# Patient Record
Sex: Female | Born: 1955 | Race: Black or African American | Hispanic: No | Marital: Single | Smoking: Never smoker
Health system: Southern US, Community
[De-identification: ages and names within clinical notes are randomized; demographics above are authoritative.]

## PROBLEM LIST (undated history)

## (undated) DIAGNOSIS — I639 Cerebral infarction, unspecified: Secondary | ICD-10-CM

## (undated) DIAGNOSIS — I10 Essential (primary) hypertension: Secondary | ICD-10-CM

---

## 2019-07-21 ENCOUNTER — Encounter (HOSPITAL_COMMUNITY): Payer: Self-pay

## 2019-07-21 ENCOUNTER — Emergency Department (HOSPITAL_COMMUNITY)
Admission: EM | Admit: 2019-07-21 | Discharge: 2019-07-21 | Disposition: A | Discharge status: Home / Self Care | Payer: BC Managed Care – PPO | Attending: Emergency Medicine | Admitting: Emergency Medicine

## 2019-07-21 ENCOUNTER — Encounter (HOSPITAL_COMMUNITY): Payer: Self-pay | Admitting: Emergency Medicine

## 2019-07-21 ENCOUNTER — Ambulatory Visit (HOSPITAL_COMMUNITY)
Admission: EM | Admit: 2019-07-21 | Discharge: 2019-07-21 | Disposition: A | Discharge status: Home / Self Care | Payer: Self-pay | Attending: Emergency Medicine | Admitting: Emergency Medicine

## 2019-07-21 ENCOUNTER — Other Ambulatory Visit: Payer: Self-pay

## 2019-07-21 DIAGNOSIS — R202 Paresthesia of skin: Secondary | ICD-10-CM | POA: Diagnosis not present

## 2019-07-21 DIAGNOSIS — R42 Dizziness and giddiness: Secondary | ICD-10-CM

## 2019-07-21 DIAGNOSIS — R5383 Other fatigue: Secondary | ICD-10-CM

## 2019-07-21 DIAGNOSIS — R531 Weakness: Secondary | ICD-10-CM

## 2019-07-21 DIAGNOSIS — Z5321 Procedure and treatment not carried out due to patient leaving prior to being seen by health care provider: Secondary | ICD-10-CM | POA: Diagnosis not present

## 2019-07-21 HISTORY — DX: Essential (primary) hypertension: I10

## 2019-07-21 LAB — BASIC METABOLIC PANEL
Anion gap: 11 (ref 5–15)
BUN: 7 mg/dL — ABNORMAL LOW (ref 8–23)
CO2: 25 mmol/L (ref 22–32)
Calcium: 9.9 mg/dL (ref 8.9–10.3)
Chloride: 104 mmol/L (ref 98–111)
Creatinine, Ser: 0.78 mg/dL (ref 0.44–1.00)
GFR calc Af Amer: >60 mL/min (ref >60)
GFR calc non Af Amer: >60 mL/min (ref >60)
Glucose, Bld: 98 mg/dL (ref 70–99)
Potassium: 3.8 mmol/L (ref 3.5–5.1)
Sodium: 140 mmol/L (ref 135–145)

## 2019-07-21 LAB — URINALYSIS, ROUTINE W REFLEX MICROSCOPIC
Bacteria, UA: NONE SEEN
Bilirubin Urine: NEGATIVE
Glucose, UA: NEGATIVE mg/dL
Ketones, ur: NEGATIVE mg/dL
Leukocytes,Ua: NEGATIVE
Nitrite: NEGATIVE
Protein, ur: NEGATIVE mg/dL
Specific Gravity, Urine: 1.001 — ABNORMAL LOW (ref 1.005–1.030)
pH: 6 (ref 5.0–8.0)

## 2019-07-21 LAB — CBC
HCT: 36.6 % (ref 36.0–46.0)
Hemoglobin: 12.9 g/dL (ref 12.0–15.0)
MCH: 27.7 pg (ref 26.0–34.0)
MCHC: 35.2 g/dL (ref 30.0–36.0)
MCV: 78.7 fL — ABNORMAL LOW (ref 80.0–100.0)
Platelets: 388 10*3/uL (ref 150–400)
RBC: 4.65 MIL/uL (ref 3.87–5.11)
RDW: 13.2 % (ref 11.5–15.5)
WBC: 9.6 10*3/uL (ref 4.0–10.5)
nRBC: 0 % (ref 0.0–0.2)

## 2019-07-21 LAB — CBG MONITORING, ED: Glucose-Capillary: 98 mg/dL (ref 70–99)

## 2019-07-21 MED ORDER — SODIUM CHLORIDE 0.9% FLUSH: 3.0000 mL | Freq: Once | INTRAVENOUS | Status: DC

## 2019-07-21 NOTE — ED Notes (Signed)
Patient is being discharged from the Urgent Care and sent to the Emergency Department via POV. Per H. Wieters, PA, patient is in need of higher level of care due to r/o CVA, HTN, weakness/fatigue. Patient is aware and verbalizes understanding of plan of care.  Vitals:   07/21/19 1803  BP: (!) 189/93  Pulse: 76  Resp: 18  Temp: 98 F (36.7 C)  SpO2: 100%

## 2019-07-21 NOTE — ED Notes (Signed)
Daryll Drown, PA advised pt to go to ER for higher level care 2/2 HTN, extremity weakness, increasing fatigue. Pt verbalized understanding.

## 2019-07-21 NOTE — ED Notes (Signed)
Called to recheck V/S, unable to locate at this time.

## 2019-07-21 NOTE — ED Notes (Signed)
Pt called for VS x3, no response. °

## 2019-07-21 NOTE — ED Triage Notes (Signed)
Pt reports "tingling and heavy feeling" to her right arm last Tuesday which resolved that day.  Since then she has been increasing feeling weak.  Negative NIH in triage.

## 2019-07-21 NOTE — ED Triage Notes (Signed)
Pt c/o "tingling and heavy" to right arm and right leg on Tuesday which resolved on Wednesday.  Pt reports she generally feels weak/tired, dizzy, increased thirst, increased urination since Thursday. Pt states she is supposed to be taking a Rx for HTN, but does not.  Denies CP, SOB, slurred/difficulty speaking, n/v/d, fever, chills.  Smile symmetrical, grips equal/moderate strength, leg equal/strong. EKG results to H. Wieters, CBG ordered/completed with result 98. Daryll Drown in for eval in triage.

## 2019-07-22 ENCOUNTER — Emergency Department (HOSPITAL_COMMUNITY): Payer: BC Managed Care – PPO

## 2019-07-22 ENCOUNTER — Other Ambulatory Visit: Payer: Self-pay

## 2019-07-22 ENCOUNTER — Observation Stay (HOSPITAL_COMMUNITY)
Admission: EM | Admit: 2019-07-22 | Discharge: 2019-07-23 | Disposition: A | Discharge status: Home / Self Care | Payer: BC Managed Care – PPO | Attending: Internal Medicine | Admitting: Internal Medicine

## 2019-07-22 DIAGNOSIS — Z20822 Contact with and (suspected) exposure to covid-19: Secondary | ICD-10-CM | POA: Insufficient documentation

## 2019-07-22 DIAGNOSIS — I639 Cerebral infarction, unspecified: Principal | ICD-10-CM

## 2019-07-22 DIAGNOSIS — I63312 Cerebral infarction due to thrombosis of left middle cerebral artery: Secondary | ICD-10-CM

## 2019-07-22 DIAGNOSIS — I10 Essential (primary) hypertension: Secondary | ICD-10-CM

## 2019-07-22 LAB — DIFFERENTIAL
Abs Immature Granulocytes: 0.02 10*3/uL (ref 0.00–0.07)
Basophils Absolute: 0.1 10*3/uL (ref 0.0–0.1)
Basophils Relative: 1 %
Eosinophils Absolute: 0.3 10*3/uL (ref 0.0–0.5)
Eosinophils Relative: 3 %
Immature Granulocytes: 0 %
Lymphocytes Relative: 42 %
Lymphs Abs: 3.9 10*3/uL (ref 0.7–4.0)
Monocytes Absolute: 0.7 10*3/uL (ref 0.1–1.0)
Monocytes Relative: 8 %
Neutro Abs: 4.5 10*3/uL (ref 1.7–7.7)
Neutrophils Relative %: 46 %

## 2019-07-22 LAB — I-STAT CHEM 8, ED
BUN: 9 mg/dL (ref 8–23)
Calcium, Ion: 1.27 mmol/L (ref 1.15–1.40)
Chloride: 101 mmol/L (ref 98–111)
Creatinine, Ser: 0.8 mg/dL (ref 0.44–1.00)
Glucose, Bld: 153 mg/dL — ABNORMAL HIGH (ref 70–99)
HCT: 35 % — ABNORMAL LOW (ref 36.0–46.0)
Hemoglobin: 11.9 g/dL — ABNORMAL LOW (ref 12.0–15.0)
Potassium: 3.7 mmol/L (ref 3.5–5.1)
Sodium: 141 mmol/L (ref 135–145)
TCO2: 29 mmol/L (ref 22–32)

## 2019-07-22 LAB — COMPREHENSIVE METABOLIC PANEL
ALT: 14 U/L (ref 0–44)
AST: 20 U/L (ref 15–41)
Albumin: 3.7 g/dL (ref 3.5–5.0)
Alkaline Phosphatase: 76 U/L (ref 38–126)
Anion gap: 11 (ref 5–15)
BUN: 7 mg/dL — ABNORMAL LOW (ref 8–23)
CO2: 26 mmol/L (ref 22–32)
Calcium: 9.4 mg/dL (ref 8.9–10.3)
Chloride: 105 mmol/L (ref 98–111)
Creatinine, Ser: 0.86 mg/dL (ref 0.44–1.00)
GFR calc Af Amer: >60 mL/min (ref >60)
GFR calc non Af Amer: >60 mL/min (ref >60)
Glucose, Bld: 156 mg/dL — ABNORMAL HIGH (ref 70–99)
Potassium: 3.9 mmol/L (ref 3.5–5.1)
Sodium: 142 mmol/L (ref 135–145)
Total Bilirubin: 0.7 mg/dL (ref 0.3–1.2)
Total Protein: 6.8 g/dL (ref 6.5–8.1)

## 2019-07-22 LAB — CBC
HCT: 34.7 % — ABNORMAL LOW (ref 36.0–46.0)
Hemoglobin: 12.1 g/dL (ref 12.0–15.0)
MCH: 27.5 pg (ref 26.0–34.0)
MCHC: 34.9 g/dL (ref 30.0–36.0)
MCV: 78.9 fL — ABNORMAL LOW (ref 80.0–100.0)
Platelets: 381 10*3/uL (ref 150–400)
RBC: 4.4 MIL/uL (ref 3.87–5.11)
RDW: 13.3 % (ref 11.5–15.5)
WBC: 9.5 10*3/uL (ref 4.0–10.5)
nRBC: 0 % (ref 0.0–0.2)

## 2019-07-22 LAB — APTT: aPTT: 27 seconds (ref 24–36)

## 2019-07-22 LAB — PROTIME-INR
INR: 0.9 (ref 0.8–1.2)
Prothrombin Time: 11.6 seconds (ref 11.4–15.2)

## 2019-07-22 MED ORDER — LORAZEPAM 2 MG/ML IJ SOLN: 1.0000 mg | Freq: Once | INTRAMUSCULAR | Status: DC | PRN

## 2019-07-22 MED ORDER — SODIUM CHLORIDE 0.9% FLUSH
3.0000 mL | Freq: Once | INTRAVENOUS | Status: DC
Start: 2019-07-22 — End: 2019-07-23

## 2019-07-22 NOTE — ED Notes (Signed)
Transported to MRI

## 2019-07-22 NOTE — ED Provider Notes (Signed)
[facility]    CSN: [id number] Arrival date & time: 07/21/19  1754      History   Chief Complaint Chief Complaint  Patient presents with   Fatigue   Extremity Weakness    HPI Lindsay Benitez is a 64 y.o. female history of hypertension presenting today for evaluation of fatigue as well as episode of right-sided tingling and weakness.  Patient reports earlier this week on Tuesday she had a sensation of weakness and tingling on the right side in her arm and leg which she woke up with.  Symptoms subsided after approximately 1 day.  She denies associated headache or vision changes.  Denies chest pain or shortness of breath.  Denied feeling confused or difficulty speaking at the time.  Since she has felt very fatigued and dizzy which has persisted but worsened over the past 24 hours.  She was previously on blood pressure medicine but has been off of this of recently.  Reports eating increased salty foods of recently.  HPI  Past Medical History:  Diagnosis Date   Hypertension     There are no problems to display for this patient.   Past Surgical History:  Procedure Laterality Date   CESAREAN SECTION      OB History   No obstetric history on file.      Home Medications    Prior to Admission medications   Not on File    Family History No family history on file.  Social History Social History   Tobacco Use   Smoking status: Not on file  Substance Use Topics   Alcohol use: Never   Drug use: Not on file     Allergies   Patient has no known allergies.   Review of Systems Review of Systems  Constitutional: Positive for fatigue. Negative for fever.  HENT: Negative for congestion, sinus pressure and sore throat.   Eyes: Negative for photophobia, pain and visual disturbance.  Respiratory: Negative for cough and shortness of breath.   Cardiovascular: Negative for chest pain.  Gastrointestinal: Negative for abdominal pain, nausea and vomiting.    Genitourinary: Negative for decreased urine volume and hematuria.  Musculoskeletal: Negative for myalgias, neck pain and neck stiffness.  Neurological: Positive for dizziness. Negative for syncope, facial asymmetry, speech difficulty, weakness, light-headedness, numbness and headaches.     Physical Exam Triage Vital Signs ED Triage Vitals  Enc Vitals Group     BP 07/21/19 1803 (!) 189/93     Pulse Rate 07/21/19 1803 76     Resp 07/21/19 1803 18     Temp 07/21/19 1803 98 F (36.7 C)     Temp Source 07/21/19 1803 Oral     SpO2 07/21/19 1803 100 %     Weight --      Height --      Head Circumference --      Peak Flow --      Pain Score 07/21/19 1802 0     Pain Loc --      Pain Edu? --      Excl. in GC? --    No data found.  Updated Vital Signs BP (!) 189/93 (BP Location: Right Arm)    Pulse 76    Temp 98 F (36.7 C) (Oral)    Resp 18    SpO2 100%   Visual Acuity Right Eye Distance:   Left Eye Distance:   Bilateral Distance:    Right Eye Near:   Left Eye Near:  Bilateral Near:     Physical Exam Vitals and nursing note reviewed.  Constitutional:      Appearance: She is well-developed.     Comments: No acute distress  HENT:     Head: Normocephalic and atraumatic.     Nose: Nose normal.  Eyes:     Extraocular Movements: Extraocular movements intact.     Conjunctiva/sclera: Conjunctivae normal.     Pupils: Pupils are equal, round, and reactive to light.  Cardiovascular:     Rate and Rhythm: Normal rate.  Pulmonary:     Effort: Pulmonary effort is normal. No respiratory distress.     Comments: Breathing comfortably at rest, CTABL, no wheezing, rales or other adventitious sounds auscultated Abdominal:     General: There is no distension.  Musculoskeletal:        General: Normal range of motion.     Cervical back: Neck supple.  Skin:    General: Skin is warm and dry.  Neurological:     General: No focal deficit present.     Mental Status: She is alert and  oriented to person, place, and time. Mental status is at baseline.     Comments: Patient A&O x3, cranial nerves II-XII grossly intact, strength at shoulders, hips and knees 5/5, equal bilaterally, patellar reflex 2+ bilaterally.  Gait without abnormality.      UC Treatments / Results  Labs (all labs ordered are listed, but only abnormal results are displayed) Labs Reviewed  CBG MONITORING, ED    EKG   Radiology No results found.  Procedures Procedures (including critical care time)  Medications Ordered in UC Medications - No data to display  Initial Impression / Assessment and Plan / UC Course  I have reviewed the triage vital signs and the nursing notes.  Pertinent labs & imaging results that were available during my care of the patient were reviewed by me and considered in my medical decision making (see chart for details).     Blood pressure elevated at 189/93, no residual weakness noted on exam today, symptoms earlier this week concerning for possible TIA.  Continued fatigue and dizziness since.  EKG normal sinus rhythm without acute signs of ischemia or infarction, blood sugar 98.  Discussed with patient recommended further evaluation and work-up in the emergency room to ensure safe to go home.  Feel would benefit from blood work as well as possible CT.  Patient verbalized understanding.  Sent independently with family member to ED.  Discussed strict return precautions. Patient verbalized understanding and is agreeable with plan.  Final Clinical Impressions(s) / UC Diagnoses   Final diagnoses:  None   Discharge Instructions   None    ED Prescriptions    None     PDMP not reviewed this encounter.   Lew Dawes, PA-C 07/22/19 1347

## 2019-07-22 NOTE — Code Documentation (Signed)
Responded to Code Stroke called at 2240 for R sided weakness and ataxia, [id number]. Code Stroke was called while pt in triage. CBG-153, NIH-0. Pt c/o dizziness on exam. CT head negative. Plan for MRI.

## 2019-07-22 NOTE — ED Notes (Signed)
Assumed care on patient , patient placed on a monitor and continuous pulse oximetry , respirations unlabored , denies pain IV site intact , no neuro deficits / no numbness at this time , she passed the swallowing test . Waiting for MRI .

## 2019-07-22 NOTE — ED Triage Notes (Addendum)
Pt presents to ED POV. Pt c/o numbness and weakness in her R arm and leg. Pt also c/o ataxia, dizziness. Neuro intact. NIHH - 0. Pt seen for same recently. Same symptoms last tues. LKW 2200 07/22/2019

## 2019-07-22 NOTE — ED Provider Notes (Signed)
[facility] EMERGENCY DEPARTMENT Provider Note   CSN: [id number] Arrival date & time: 07/22/19  2215  An emergency department physician performed an initial assessment on this suspected stroke patient at 2230.  History Chief Complaint  Patient presents with  . Code Stroke  . Fatigue    [name] is a 64 y.o. female.  The history is provided by the patient and medical records.    64 y.o. F with hx of HTN, presenting to the ED for right sided weakness/tingling.  States the first episode occurred on Tuesday (07/17/19) and fully resolved after about 1 day so she was not evaluated.  States she had a recurrence of symptoms yesterday along with dizziness and fatigue so she went to urgent care.  States she was sent to the ED and waited for approx 6 hours before leaving -- states her shoulder started hurting so she left.  States prior to this past week she has never had symptoms like this before.  She does report history of hypertension and states she is not very compliant with her medications.  Past Medical History:  Diagnosis Date  . Hypertension     There are no problems to display for this patient.   Past Surgical History:  Procedure Laterality Date  . CESAREAN SECTION       OB History   No obstetric history on file.     No family history on file.  Social History   Tobacco Use  . Smoking status: Not on file  Substance Use Topics  . Alcohol use: Never  . Drug use: Not on file    Home Medications Prior to Admission medications   Not on File    Allergies    Patient has no known allergies.  Review of Systems   Review of Systems  Neurological: Positive for weakness.  All other systems reviewed and are negative.   Physical Exam Updated Vital Signs BP (!) 143/77 (BP Location: Right Arm)   Pulse 87   Temp 98.4 F (36.9 C) (Oral)   Resp 16   Ht 5\' 2"  (1.575 m)   Wt 77.1 kg   SpO2 97%   BMI 31.09 kg/m   Physical Exam Vitals and  nursing note reviewed.  Constitutional:      Appearance: She is well-developed.  HENT:     Head: Normocephalic and atraumatic.  Eyes:     Conjunctiva/sclera: Conjunctivae normal.     Pupils: Pupils are equal, round, and reactive to light.  Cardiovascular:     Rate and Rhythm: Normal rate and regular rhythm.     Heart sounds: Normal heart sounds.  Pulmonary:     Effort: Pulmonary effort is normal.     Breath sounds: Normal breath sounds.  Abdominal:     General: Bowel sounds are normal.     Palpations: Abdomen is soft.  Musculoskeletal:        General: Normal range of motion.     Cervical back: Normal range of motion.  Skin:    General: Skin is warm and dry.  Neurological:     Mental Status: She is alert and oriented to person, place, and time.     Comments: AAOx3, answering questions and following commands appropriately; equal strength UE and LE bilaterally; CN grossly intact; moves all extremities appropriately without ataxia; no focal neuro deficits or facial asymmetry appreciated, speech clear and goal oriented     ED Results / Procedures / Treatments   Labs (all labs ordered  are listed, but only abnormal results are displayed) Labs Reviewed  CBC - Abnormal; Notable for the following components:      Result Value   HCT 34.7 (*)    MCV 78.9 (*)    All other components within normal limits  COMPREHENSIVE METABOLIC PANEL - Abnormal; Notable for the following components:   Glucose, Bld 156 (*)    BUN 7 (*)    All other components within normal limits  I-STAT CHEM 8, ED - Abnormal; Notable for the following components:   Glucose, Bld 153 (*)    Hemoglobin 11.9 (*)    HCT 35.0 (*)    All other components within normal limits  SARS CORONAVIRUS 2 BY RT PCR (HOSPITAL ORDER, PERFORMED IN [facility])  PROTIME-INR  APTT  DIFFERENTIAL    EKG None  Radiology CT HEAD CODE STROKE WO CONTRAST`  Result Date: 07/22/2019 CLINICAL DATA:  Code stroke.  Right-sided  numbness EXAM: CT HEAD WITHOUT CONTRAST TECHNIQUE: Contiguous axial images were obtained from the base of the skull through the vertex without intravenous contrast. COMPARISON:  None. FINDINGS: Brain: There is no mass, hemorrhage or extra-axial collection. The size and configuration of the ventricles and extra-axial CSF spaces are normal. The brain parenchyma is normal, without evidence of acute or chronic infarction. There is dense calcification within the basal ganglia, dentate nuclei and both occipital lobes. Vascular: No abnormal hyperdensity of the major intracranial arteries or dural venous sinuses. No intracranial atherosclerosis. Skull: The visualized skull base, calvarium and extracranial soft tissues are normal. Sinuses/Orbits: No fluid levels or advanced mucosal thickening of the visualized paranasal sinuses. No mastoid or middle ear effusion. The orbits are normal. ASPECTS [facility] Stroke Program Early CT Score) - Ganglionic level infarction (caudate, lentiform nuclei, internal capsule, insula, M1-M3 cortex): 7 - Supraganglionic infarction (M4-M6 cortex): 3 Total score (0-10 with 10 being normal): 10 IMPRESSION: 1. No acute intracranial abnormality. 2. ASPECTS is 10. 3. Dense calcification of the basal ganglia, dentate nuclei and both occipital lobes. 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 4. These results were communicated to Dr. Georgiana Spinner Aroor at 10:53 pm on 07/22/2019 by text page via the [facility] messaging system. Electronically Signed   By: Deatra Robinson M.D.   On: 07/22/2019 22:56    Procedures Procedures (including critical care time)    Medications Ordered in ED Medications  sodium chloride flush (NS) 0.9 % injection 3 mL (3 mLs Intravenous Not Given 07/22/19 2317)  LORazepam (ATIVAN) injection 1 mg (has no administration in time range)  gadobutrol (GADAVIST) 1 MMOL/ML injection 7.5 mL (7.5 mLs Intravenous Contrast Given 07/23/19 0051)    ED  Course  I have reviewed the triage vital signs and the nursing notes.  Pertinent labs & imaging results that were available during my care of the patient were reviewed by me and considered in my medical decision making (see chart for details).    MDM Rules/Calculators/A&P  64 year old female presenting to the ED as a code stroke.  Has had intermittent right-sided tingling and weakness over the past week, symptoms recurred again today.  LKW 10PM.  Code stroke activated in triage.  Dr. Laurence Slate with neurology has evaluated-- no focal deficits on exam currently.  Will get MRI brain along with MRA head/neck.  Will discuss once resulted to determine admission/OP follow-up.  1:21 AM MRI does reveal small area of acute ischemia in left caudate tail.  Patient and son at bedside  have been updated.  Have also updated neurology with MRI findings-- will load with plavix and start daily ASA.  Will admit for full stroke work-up.  Spoke with Dr. Rachael Darby-- will admit for ongoing care.  Final Clinical Impression(s) / ED Diagnoses Final diagnoses:  Acute ischemic stroke [facility])    Rx / DC Orders ED Discharge Orders    None       Garlon Hatchet, PA-C 07/23/19 0350    Maia Plan, MD 07/28/19 2037

## 2019-07-23 ENCOUNTER — Observation Stay (HOSPITAL_BASED_OUTPATIENT_CLINIC_OR_DEPARTMENT_OTHER): Payer: BC Managed Care – PPO

## 2019-07-23 ENCOUNTER — Encounter (HOSPITAL_COMMUNITY): Payer: Self-pay | Admitting: Family Medicine

## 2019-07-23 DIAGNOSIS — I6389 Other cerebral infarction: Secondary | ICD-10-CM

## 2019-07-23 DIAGNOSIS — I639 Cerebral infarction, unspecified: Secondary | ICD-10-CM | POA: Diagnosis not present

## 2019-07-23 DIAGNOSIS — I10 Essential (primary) hypertension: Secondary | ICD-10-CM | POA: Diagnosis not present

## 2019-07-23 LAB — RAPID URINE DRUG SCREEN, HOSP PERFORMED
Amphetamines: NOT DETECTED
Barbiturates: NOT DETECTED
Benzodiazepines: NOT DETECTED
Cocaine: NOT DETECTED
Opiates: NOT DETECTED
Tetrahydrocannabinol: NOT DETECTED

## 2019-07-23 LAB — ECHOCARDIOGRAM COMPLETE
Height: 62 in
Weight: 2720 oz

## 2019-07-23 LAB — SARS CORONAVIRUS 2 BY RT PCR (HOSPITAL ORDER, PERFORMED IN ~~LOC~~ HOSPITAL LAB): SARS Coronavirus 2: NEGATIVE

## 2019-07-23 LAB — MAGNESIUM: Magnesium: 1.9 mg/dL (ref 1.7–2.4)

## 2019-07-23 LAB — LIPID PANEL
Cholesterol: 294 mg/dL — ABNORMAL HIGH (ref 0–200)
HDL: 79 mg/dL (ref >40)
LDL Cholesterol: 203 mg/dL — ABNORMAL HIGH (ref 0–99)
Total CHOL/HDL Ratio: 3.7 RATIO
Triglycerides: 58 mg/dL (ref <150)
VLDL: 12 mg/dL (ref 0–40)

## 2019-07-23 LAB — PHOSPHORUS: Phosphorus: 3.6 mg/dL (ref 2.5–4.6)

## 2019-07-23 LAB — HIV ANTIBODY (ROUTINE TESTING W REFLEX): HIV Screen 4th Generation wRfx: NONREACTIVE

## 2019-07-23 MED ORDER — CLOPIDOGREL BISULFATE 75 MG PO TABS: 75.0000 mg | ORAL_TABLET | Freq: Every day | ORAL | Status: DC

## 2019-07-23 MED ORDER — AMLODIPINE BESYLATE 5 MG PO TABS: 5.0000 mg | ORAL_TABLET | Freq: Every day | ORAL | 0 refills | Status: DC

## 2019-07-23 MED ORDER — ACETAMINOPHEN 325 MG PO TABS: 650.0000 mg | ORAL_TABLET | ORAL | Status: DC | PRN

## 2019-07-23 MED ORDER — ACETAMINOPHEN 650 MG RE SUPP: 650.0000 mg | RECTAL | Status: DC | PRN

## 2019-07-23 MED ORDER — CLOPIDOGREL BISULFATE 75 MG PO TABS: 75.0000 mg | ORAL_TABLET | Freq: Every day | ORAL | 0 refills | Status: AC

## 2019-07-23 MED ORDER — ATORVASTATIN CALCIUM 80 MG PO TABS: 80.0000 mg | ORAL_TABLET | Freq: Every day | ORAL | Status: DC

## 2019-07-23 MED ORDER — CLOPIDOGREL BISULFATE 300 MG PO TABS
300.0000 mg | ORAL_TABLET | Freq: Once | ORAL | Status: AC
  Administered 2019-07-23: 300 mg via ORAL
  Filled 2019-07-23: qty 1

## 2019-07-23 MED ORDER — ACETAMINOPHEN 160 MG/5ML PO SOLN: 650.0000 mg | ORAL | Status: DC | PRN

## 2019-07-23 MED ORDER — ASPIRIN 81 MG PO CHEW
324.0000 mg | CHEWABLE_TABLET | Freq: Once | ORAL | Status: AC
  Administered 2019-07-23: 324 mg via ORAL
  Filled 2019-07-23: qty 4

## 2019-07-23 MED ORDER — ATORVASTATIN CALCIUM 80 MG PO TABS: 80.0000 mg | ORAL_TABLET | Freq: Every day | ORAL | 2 refills | Status: DC

## 2019-07-23 MED ORDER — SODIUM CHLORIDE 0.9 % IV SOLN: INTRAVENOUS | Status: DC

## 2019-07-23 MED ORDER — STROKE: EARLY STAGES OF RECOVERY BOOK
Freq: Once | Status: AC
  Filled 2019-07-23: qty 1

## 2019-07-23 MED ORDER — GADOBUTROL 1 MMOL/ML IV SOLN
7.5000 mL | Freq: Once | INTRAVENOUS | Status: AC | PRN
  Administered 2019-07-23: 7.5 mL via INTRAVENOUS

## 2019-07-23 MED ORDER — ASPIRIN 81 MG PO TBEC: 81.0000 mg | DELAYED_RELEASE_TABLET | Freq: Every day | ORAL | 11 refills | Status: AC

## 2019-07-23 MED ORDER — AMLODIPINE BESYLATE 5 MG PO TABS: 5.0000 mg | ORAL_TABLET | Freq: Every day | ORAL | Status: DC

## 2019-07-23 MED ORDER — ASPIRIN EC 81 MG PO TBEC: 81.0000 mg | DELAYED_RELEASE_TABLET | Freq: Every day | ORAL | Status: DC

## 2019-07-23 MED ORDER — SENNOSIDES-DOCUSATE SODIUM 8.6-50 MG PO TABS: 1.0000 | ORAL_TABLET | Freq: Every evening | ORAL | Status: DC | PRN

## 2019-07-23 NOTE — Progress Notes (Signed)
STROKE TEAM PROGRESS NOTE   INTERVAL HISTORY No family at bedside. Pt doing well stated that her symptoms all gone. She stated that she has not been on his BP meds since 12/2018. She denies any neuro deficit prior to this event.   Vitals:   07/23/19 0257 07/23/19 0500 07/23/19 0600 07/23/19 0730  BP: 127/84 108/66 121/69 117/73  Pulse: 82 84 61 60  Resp: 16 16 16 18   Temp:      TempSrc:      SpO2: 99% 100% 100% 93%  Weight:      Height:       CBC:  Recent Labs  Lab 07/21/19 1959 07/21/19 1959 07/22/19 2233 07/22/19 2246  WBC 9.6  --  9.5  --   NEUTROABS  --   --  4.5  --   HGB 12.9   < > 12.1 11.9*  HCT 36.6   < > 34.7* 35.0*  MCV 78.7*  --  78.9*  --   PLT 388  --  381  --    < > = values in this interval not displayed.   Basic Metabolic Panel:  Recent Labs  Lab 07/21/19 1959 07/21/19 1959 07/22/19 2233 07/22/19 2246  NA 140   < > 142 141  K 3.8   < > 3.9 3.7  CL 104   < > 105 101  CO2 25  --  26  --   GLUCOSE 98   < > 156* 153*  BUN 7*   < > 7* 9  CREATININE 0.78   < > 0.86 0.80  CALCIUM 9.9  --  9.4  --    < > = values in this interval not displayed.   Lipid Panel:  Recent Labs  Lab 07/23/19 0249  CHOL 294*  TRIG 58  HDL 79  CHOLHDL 3.7  VLDL 12  LDLCALC 09/23/19*   HgbA1c: No results for input(s): HGBA1C in the last 168 hours. Urine Drug Screen: No results for input(s): LABOPIA, COCAINSCRNUR, LABBENZ, AMPHETMU, THCU, LABBARB in the last 168 hours.  Alcohol Level No results for input(s): ETH in the last 168 hours.  IMAGING past 24 hours MR ANGIO HEAD WO CONTRAST  Result Date: 07/23/2019 CLINICAL DATA:  Transient ischemic attack EXAM: MR HEAD WITHOUT CONTRAST MR CIRCLE OF WILLIS WITHOUT CONTRAST MRA OF THE NECK WITHOUT AND WITH CONTRAST TECHNIQUE: Multiplanar, multiecho pulse sequences of the brain, circle of willis and surrounding structures were obtained without intravenous contrast. Angiographic images of the neck were obtained using MRA technique  without and with intravenous contrast. CONTRAST:  7.6mL GADAVIST GADOBUTROL 1 MMOL/ML IV SOLN COMPARISON:  None. FINDINGS: MRI HEAD FINDINGS BRAIN: The midline structures are normal. Small acute infarct of the left caudate tail. 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 The CSF spaces are normal for age, with no hydrocephalus. Blood-sensitive sequences show no chronic microhemorrhage or superficial siderosis. SKULL AND UPPER CERVICAL SPINE: The visualized skull base, calvarium, upper cervical spine and extracranial soft tissues are normal. SINUSES/ORBITS: No fluid levels or advanced mucosal thickening. No mastoid or middle ear effusion. The orbits are normal. MRA HEAD FINDINGS POSTERIOR CIRCULATION: --Basilar artery: Normal. 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 Normal right PCA. --Superior cerebellar arteries: Normal. --Inferior cerebellar arteries: Normal anterior and posterior inferior cerebellar arteries. ANTERIOR CIRCULATION: --Intracranial internal carotid arteries: Normal. --Anterior cerebral arteries: Normal. Both A1 segments are present. Patent anterior communicating artery. --Middle cerebral arteries: Normal. --Posterior  communicating arteries: Absent MRA NECK FINDINGS Aortic arch: Normal 3 vessel aortic branching pattern. The visualized subclavian arteries are normal. Right carotid system: Normal course and caliber without stenosis or evidence of dissection. Left carotid system: Normal course and caliber without stenosis or evidence of dissection. Vertebral arteries: Left dominant. Vertebral artery origins are normal. Vertebral arteries are normal in course and caliber to the vertebrobasilar confluence without stenosis or evidence of dissection. IMPRESSION: 1. Small acute infarct of the left caudate tail. No hemorrhage or mass effect. 2. 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 Otherwise normal  intracranial MRA. 3. Normal MRA of the neck. Electronically Signed   By: Deatra Robinson M.D.   On: 07/23/2019 01:08   MR Angiogram Neck W or Wo Contrast  Result Date: 07/23/2019 CLINICAL DATA:  Transient ischemic attack EXAM: MR HEAD WITHOUT CONTRAST MR CIRCLE OF WILLIS WITHOUT CONTRAST MRA OF THE NECK WITHOUT AND WITH CONTRAST TECHNIQUE: Multiplanar, multiecho pulse sequences of the brain, circle of willis and surrounding structures were obtained without intravenous contrast. Angiographic images of the neck were obtained using MRA technique without and with intravenous contrast. CONTRAST:  7.57mL GADAVIST GADOBUTROL 1 MMOL/ML IV SOLN COMPARISON:  None. FINDINGS: MRI HEAD FINDINGS BRAIN: The midline structures are normal. Small acute infarct of the left caudate tail. 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 The CSF spaces are normal for age, with no hydrocephalus. Blood-sensitive sequences show no chronic microhemorrhage or superficial siderosis. SKULL AND UPPER CERVICAL SPINE: The visualized skull base, calvarium, upper cervical spine and extracranial soft tissues are normal. SINUSES/ORBITS: No fluid levels or advanced mucosal thickening. No mastoid or middle ear effusion. The orbits are normal. MRA HEAD FINDINGS POSTERIOR CIRCULATION: --Basilar artery: Normal. 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 Normal right PCA. --Superior cerebellar arteries: Normal. --Inferior cerebellar arteries: Normal anterior and posterior inferior cerebellar arteries. ANTERIOR CIRCULATION: --Intracranial internal carotid arteries: Normal. --Anterior cerebral arteries: Normal. Both A1 segments are present. Patent anterior communicating artery. --Middle cerebral arteries: Normal. --Posterior communicating arteries: Absent MRA NECK FINDINGS Aortic arch: Normal 3 vessel aortic branching pattern. The visualized subclavian arteries are  normal. Right carotid system: Normal course and caliber without stenosis or evidence of dissection. Left carotid system: Normal course and caliber without stenosis or evidence of dissection. Vertebral arteries: Left dominant. Vertebral artery origins are normal. Vertebral arteries are normal in course and caliber to the vertebrobasilar confluence without stenosis or evidence of dissection. IMPRESSION: 1. Small acute infarct of the left caudate tail. No hemorrhage or mass effect. 2. 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 Otherwise normal intracranial MRA. 3. Normal MRA of the neck. Electronically Signed   By: Deatra Robinson M.D.   On: 07/23/2019 01:08   MR BRAIN WO CONTRAST  Result Date: 07/23/2019 CLINICAL DATA:  Transient ischemic attack EXAM: MR HEAD WITHOUT CONTRAST MR CIRCLE OF WILLIS WITHOUT CONTRAST MRA OF THE NECK WITHOUT AND WITH CONTRAST TECHNIQUE: Multiplanar, multiecho pulse sequences of the brain, circle of willis and surrounding structures were obtained without intravenous contrast. Angiographic images of the neck were obtained using MRA technique without and with intravenous contrast. CONTRAST:  7.77mL GADAVIST GADOBUTROL 1 MMOL/ML IV SOLN COMPARISON:  None. FINDINGS: MRI HEAD FINDINGS BRAIN: The midline structures are normal. Small acute infarct of the left caudate tail. 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 The CSF spaces are normal for age, with no hydrocephalus. Blood-sensitive sequences show no chronic  microhemorrhage or superficial siderosis. SKULL AND UPPER CERVICAL SPINE: The visualized skull base, calvarium, upper cervical spine and extracranial soft tissues are normal. SINUSES/ORBITS: No fluid levels or advanced mucosal thickening. No mastoid or middle ear effusion. The orbits are normal. MRA HEAD FINDINGS POSTERIOR CIRCULATION: --Basilar artery: Normal. --Posterior cerebral arteries: Multifocal moderate stenosis of the left  PCA P2 segment. Normal right PCA. --Superior cerebellar arteries: Normal. --Inferior cerebellar arteries: Normal anterior and posterior inferior cerebellar arteries. ANTERIOR CIRCULATION: --Intracranial internal carotid arteries: Normal. --Anterior cerebral arteries: Normal. Both A1 segments are present. Patent anterior communicating artery. --Middle cerebral arteries: Normal. --Posterior communicating arteries: Absent MRA NECK FINDINGS Aortic arch: Normal 3 vessel aortic branching pattern. The visualized subclavian arteries are normal. Right carotid system: Normal course and caliber without stenosis or evidence of dissection. Left carotid system: Normal course and caliber without stenosis or evidence of dissection. Vertebral arteries: Left dominant. Vertebral artery origins are normal. Vertebral arteries are normal in course and caliber to the vertebrobasilar confluence without stenosis or evidence of dissection. IMPRESSION: 1. Small acute infarct of the left caudate tail. No hemorrhage or mass effect. 2. 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 Otherwise normal intracranial MRA. 3. Normal MRA of the neck. Electronically Signed   By: Deatra RobinsonKevin  Herman M.D.   On: 07/23/2019 01:08   CT HEAD CODE STROKE WO CONTRAST`  Result Date: 07/22/2019 CLINICAL DATA:  Code stroke.  Right-sided numbness EXAM: CT HEAD WITHOUT CONTRAST TECHNIQUE: Contiguous axial images were obtained from the base of the skull through the vertex without intravenous contrast. COMPARISON:  None. FINDINGS: Brain: There is no mass, hemorrhage or extra-axial collection. The size and configuration of the ventricles and extra-axial CSF spaces are normal. The brain parenchyma is normal, without evidence of acute or chronic infarction. There is dense calcification within the basal ganglia, dentate nuclei and both occipital lobes. Vascular: No abnormal hyperdensity of the major intracranial arteries or dural venous sinuses. No intracranial  atherosclerosis. Skull: The visualized skull base, calvarium and extracranial soft tissues are normal. Sinuses/Orbits: No fluid levels or advanced mucosal thickening of the visualized paranasal sinuses. No mastoid or middle ear effusion. The orbits are normal. ASPECTS [facility]([place] Stroke Program Early CT Score) - Ganglionic level infarction (caudate, lentiform nuclei, internal capsule, insula, M1-M3 cortex): 7 - Supraganglionic infarction (M4-M6 cortex): 3 Total score (0-10 with 10 being normal): 10 IMPRESSION: 1. No acute intracranial abnormality. 2. ASPECTS is 10. 3. Dense calcification of the basal ganglia, dentate nuclei and both occipital lobes. 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 4. These results were communicated to Dr. Georgiana SpinnerSushanth Aroor at 10:53 pm on 07/22/2019 by text page via the [facility] messaging system. Electronically Signed   By: Deatra RobinsonKevin  Herman M.D.   On: 07/22/2019 22:56    PHYSICAL EXAM  Temp:  [97.3 F (36.3 C)-98.4 F (36.9 C)] 97.3 F (36.3 C) (07/12 1104) Pulse Rate:  [54-87] 65 (07/12 1104) Resp:  [15-18] 16 (07/12 1104) BP: (108-147)/(66-96) 147/79 (07/12 1104) SpO2:  [93 %-100 %] 95 % (07/12 1104) Weight:  [77.1 kg] 77.1 kg (07/11 2219)  General - Well nourished, well developed, in no apparent distress.  Ophthalmologic - fundi not visualized due to noncooperation.  Cardiovascular - Regular rhythm and rate.  Mental Status -  Level of arousal and orientation to time, place, and person were intact. Language including expression, naming, repetition, comprehension was assessed and found intact. Fund of Knowledge was assessed and was intact.  Cranial Nerves II - XII - II - Visual field intact OU. III, IV, VI - Extraocular movements intact. V - Facial sensation intact bilaterally. VII - Facial movement intact bilaterally. VIII - Hearing & vestibular intact bilaterally. X - Palate elevates symmetrically. XI - Chin turning &  shoulder shrug intact bilaterally. XII - Tongue protrusion intact.  Motor Strength - The patient's strength was normal in all extremities and pronator drift was absent.  Bulk was normal and fasciculations were absent.   Motor Tone - Muscle tone was assessed at the neck and appendages and was normal.  Reflexes - The patient's reflexes were symmetrical in all extremities and she had no pathological reflexes.  Sensory - Light touch, temperature/pinprick were assessed and were symmetrical.    Coordination - The patient had normal movements in the hands and feet with no ataxia or dysmetria.  Tremor was absent.  Gait and Station - deferred.   ASSESSMENT/PLAN [name] is a 64 y.o. female with history of HTN presenting with dizziness, recurrent R arm and leg numbness since 7/6.  Stroke:   Small L BG/CR infarct secondary to small vessel disease source  Code Stroke CT head No acute abnormality.    MRI  Small L BG/CR infarct.   MRA head Moderate L P2 stenosis   MRA neck Unremarkable   2D Echo pending  LDL 203  HgbA1c pending   VTE prophylaxis - SCDs   No antithrombotic prior to admission, now on aspirin 81 mg daily and clopidogrel 75 mg daily following plavix load. 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  Pt denies any neuro deficit PTA  Serum Ca, Mg, Phos neg. PTH, calcitonin pending  No need any treatment  Follow up as outpt  Hypertension  Not on home BP meds since 12/2018 - pt did not refill  Stable  . Permissive hypertension (OK if < 220/120) but gradually normalize in 2-3 days . Long-term BP goal normotensive  Hyperlipidemia  Home meds:  No statin  Now on  lipitor 80  LDL 203, goal < 70  Continue statin at discharge  Other Stroke Risk Factors  UDS  pending   Obesity, Body mass index is 31.09 kg/m., recommend weight loss, diet and exercise as appropriate   Other Active Problems    Hospital day # 0  Neurology will sign off. Please call with questions. Pt will follow up with stroke clinic NP at [facility] in about 4 weeks. Thanks for the consult.  Marvel Plan, MD PhD Stroke Neurology 07/23/2019 1:45 PM   To contact Stroke Continuity provider, please refer to [email or web address]. After hours, contact General Neurology

## 2019-07-23 NOTE — Progress Notes (Signed)
  Echocardiogram 2D Echocardiogram has been performed.  Lindsay Benitez 07/23/2019, 3:19 PM

## 2019-07-23 NOTE — Progress Notes (Signed)
PT Cancellation Note and Discharge  Patient Details Name: Lindsay Benitez MRN: [id number] DOB: Feb 04, 1955   Cancelled Treatment:    Reason Eval/Treat Not Completed: PT screened, no needs identified, will sign off.  Discussed pt case with OT who reports that pt's symptoms have resolved and pt is independent with functional mobility, demonstrating higher level balance during activity. Acute PT evaluation not warranted at this time. PT signing off; if needs change, please reconsult.    Marylynn Pearson 07/23/2019, 8:28 AM   Conni Slipper, PT, DPT Acute Rehabilitation Services Pager: [phone] Office: [phone]

## 2019-07-23 NOTE — ED Notes (Signed)
sats decreased to 89% on RA placed on 2 l/Levasy

## 2019-07-23 NOTE — H&P (Signed)
History and Physical    Lindsay Benitez YJE:[id number] DOB: 17-Oct-1955 DOA: 07/22/2019  PCP: Patient, No Pcp Per   Patient coming from: home  Chief Complaint: Right sided weakness and numbness intermittently for past week  HPI: Lindsay Benitez is a 64 y.o. female with medical history significant for  Hypertension.  She states that she has been treated with antihypertensive medication in the past but does not member which medication it was.  She ran out of the medication approximately 8 months ago and has been on no therapy since then.  She reports having right-sided weakness and numbness intermittently for the past week.  She was seen on July 21, 2019 at an urgent care for the weakness and numbness on her right side and was referred to the emergency room at that time.  She came to the emergency room and after waiting for 8 hours decide to go home without being seen.  Tonight she returned as she had another episode of numbness and weakness of her right side associated with dizziness on 8 PM last night.  She reports the episodes of right-sided weakness and numbness lasted approximately 15 minutes and then spontaneously resolved will recur later.  She states she has not had any chest pain, palpitations, loss of consciousness, seizure activity, abdominal pain, nausea, vomiting, diarrhea, urinary symptoms, cough, shortness of breath. On arrival to [facility] ED, due to sudden onset of stroke symptoms EDP activated code stroke.  Patient had no weakness.  NIH stroke scale was 1 for numbness.  Had minimal difficulty walking.  tPA was not administered due to improving mild and nondisabling symptoms as well as unclear time of actual onset due to waxing and waning of symptoms  ED Course: Patient had a normal exam and unremarkable lab work in the emergency room.  MRI of the brain did show a CVA so hospital service was asked to admit for further stroke work-up.  Neurology was consulted and saw patient in the emergency  room  Review of Systems:  General: Intermittent right-sided weakness.  Denies fever, chills, weight loss, night sweats.  Denies dizziness.  Denies change in appetite HENT: Denies head trauma, headache, denies change in hearing, tinnitus.  Denies nasal congestion or bleeding.  Denies sore throat, sores in mouth.  Denies difficulty swallowing Eyes: Denies blurry vision, pain in eye, drainage.  Denies discoloration of eyes. Neck: Denies pain.  Denies swelling.  Denies pain with movement. Cardiovascular:  Denies chest pain, palpitations.  Denies edema.  Denies orthopnea Respiratory: Denies shortness of breath, cough.  Denies wheezing.  Denies sputum production Gastrointestinal: Denies abdominal pain, swelling.  Denies nausea, vomiting, diarrhea.  Denies melena.  Denies hematemesis. Musculoskeletal: Denies limitation of movement.  Denies deformity or swelling.  Denies pain.  Denies arthralgias or myalgias. Genitourinary: Denies pelvic pain.  Denies urinary frequency or hesitancy.  Denies dysuria.  Skin: Denies rash.  Denies petechiae, purpura, ecchymosis. Neurological: Denies headache.  Denies syncope.  Denies seizure activity.  Reports right-sided weakness or paresthesia intermittently over the last week.  Denies slurred speech, drooping face.  Denies visual change. Psychiatric: Denies depression, anxiety.  Denies suicidal thoughts or ideation.  Denies hallucinations.  Past Medical History:  Diagnosis Date  . Hypertension     Past Surgical History:  Procedure Laterality Date  . CESAREAN SECTION      Social History  reports that she has never smoked. She has never used smokeless tobacco. She reports that she does not drink alcohol and does not use drugs.  No Known Allergies  History reviewed. No pertinent family history.   Prior to Admission medications   Not on File    Physical Exam: Vitals:   07/23/19 0101 07/23/19 0114 07/23/19 0131 07/23/19 0201  BP: (!) 111/96 112/68 (!)  143/86 (!) 108/92  Pulse:  74 64 64  Resp:  16 16 18   Temp:      TempSrc:      SpO2:  99% 99% 100%  Weight:      Height:        Constitutional: NAD, calm, comfortable Vitals:   07/23/19 0101 07/23/19 0114 07/23/19 0131 07/23/19 0201  BP: (!) 111/96 112/68 (!) 143/86 (!) 108/92  Pulse:  74 64 64  Resp:  16 16 18   Temp:      TempSrc:      SpO2:  99% 99% 100%  Weight:      Height:       General: WDWN, Alert and oriented x3.  Eyes: EOMI, PERRL, lids and conjunctivae normal.  Sclera nonicteric HENT:  [place]/AT, external ears normal.  Nares patent without epistasis.  Mucous membranes are moist. Posterior pharynx clear of any exudate or lesions. Normal dentition.  Neck: Soft, normal range of motion, supple, no masses, no thyromegaly.  Trachea midline Respiratory: clear to auscultation bilaterally, no wheezing, no crackles. Normal respiratory effort. No accessory muscle use.  Cardiovascular: Regular rate and rhythm, no murmurs / rubs / gallops. No extremity edema. 2+ pedal pulses. No carotid bruits.  Abdomen: Soft, no tenderness, nondistended, no rebound or guarding.  No masses palpated. Bowel sounds normoactive Musculoskeletal: FROM. no clubbing / cyanosis. No joint deformity upper and lower extremities. no contractures. Normal muscle tone.  Skin: Warm, dry, intact no rashes, lesions, ulcers. No induration Neurologic: CN 2-12 grossly intact.  Normal speech.  Sensation intact, patella DTR +1 bilaterally. Strength 5/5 in all extremities.  No drift.  Babinski downgoing bilaterally Psychiatric: Normal judgment and insight.  Normal mood.  NIH stroke scale of 1   Labs on Admission: I have personally reviewed following labs and imaging studies  CBC: Recent Labs  Lab 07/21/19 1959 07/22/19 2233 07/22/19 2246  WBC 9.6 9.5  --   NEUTROABS  --  4.5  --   HGB 12.9 12.1 11.9*  HCT 36.6 34.7* 35.0*  MCV 78.7* 78.9*  --   PLT 388 381  --     Basic Metabolic Panel: Recent Labs  Lab  07/21/19 1959 07/22/19 2233 07/22/19 2246  NA 140 142 141  K 3.8 3.9 3.7  CL 104 105 101  CO2 25 26  --   GLUCOSE 98 156* 153*  BUN 7* 7* 9  CREATININE 0.78 0.86 0.80  CALCIUM 9.9 9.4  --     GFR: Estimated Creatinine Clearance: 71 mL/min (by C-G formula based on SCr of 0.8 mg/dL).  Liver Function Tests: Recent Labs  Lab 07/22/19 2233  AST 20  ALT 14  ALKPHOS 76  BILITOT 0.7  PROT 6.8  ALBUMIN 3.7    Urine analysis:    Component Value Date/Time   COLORURINE COLORLESS (A) 07/21/2019 2027   APPEARANCEUR CLEAR 07/21/2019 2027   LABSPEC 1.001 (L) 07/21/2019 2027   PHURINE 6.0 07/21/2019 2027   GLUCOSEU NEGATIVE 07/21/2019 2027   HGBUR SMALL (A) 07/21/2019 2027   BILIRUBINUR NEGATIVE 07/21/2019 2027   KETONESUR NEGATIVE 07/21/2019 2027   PROTEINUR NEGATIVE 07/21/2019 2027   NITRITE NEGATIVE 07/21/2019 2027   LEUKOCYTESUR NEGATIVE 07/21/2019 2027    Radiological Exams  on Admission: CT HEAD CODE STROKE WO CONTRAST`  Result Date: 07/22/2019 CLINICAL DATA:  Code stroke.  Right-sided numbness EXAM: CT HEAD WITHOUT CONTRAST TECHNIQUE: Contiguous axial images were obtained from the base of the skull through the vertex without intravenous contrast. COMPARISON:  None. FINDINGS: Brain: There is no mass, hemorrhage or extra-axial collection. The size and configuration of the ventricles and extra-axial CSF spaces are normal. The brain parenchyma is normal, without evidence of acute or chronic infarction. There is dense calcification within the basal ganglia, dentate nuclei and both occipital lobes. Vascular: No abnormal hyperdensity of the major intracranial arteries or dural venous sinuses. No intracranial atherosclerosis. Skull: The visualized skull base, calvarium and extracranial soft tissues are normal. Sinuses/Orbits: No fluid levels or advanced mucosal thickening of the visualized paranasal sinuses. No mastoid or middle ear effusion. The orbits are normal. ASPECTS [facility]  Stroke Program Early CT Score) - Ganglionic level infarction (caudate, lentiform nuclei, internal capsule, insula, M1-M3 cortex): 7 - Supraganglionic infarction (M4-M6 cortex): 3 Total score (0-10 with 10 being normal): 10 IMPRESSION: 1. No acute intracranial abnormality. 2. ASPECTS is 10. 3. Dense calcification of the basal ganglia, dentate nuclei and both occipital lobes. 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 4. These results were communicated to Dr. Georgiana Spinner Aroor at 10:53 pm on 07/22/2019 by text page via the [facility] messaging system. Electronically Signed   By: Deatra Robinson M.D.   On: 07/22/2019 22:56    EKG: Independently reviewed.  EKG shows normal sinus rhythm with no acute ST changes.  Normal QTC  Assessment/Plan Principal Problem:   Acute CVA (cerebrovascular accident) [facility]) Patient be observed on medical telemetry floor for TIA/CVA.  MRI of the brain and MRA of the head and neck have been obtained in the emergency room. Obtain echocardiogram to evaluate for PFO, wall motion and ejection fraction. Hypertension of 220/110 will be allowed for 24 hours per stroke protocol.  After which blood pressure will be slowly reduced to goal level. Antiplatelet therapy with aspirin daily. Check lipid panel.  Initiate statin therapy with Lipitor 80 mg daily.  High-dose Lipitor has been shown in studies to decrease risk of recurrent stroke Neurochecks per stroke protocol    Essential hypertension Monitor blood pressure.  Will initiate antihypertensive therapy for period of permissive hypertension completed.    DVT prophylaxis: SCDs for DVT prophylaxis.  Anticoagulation is held secondary to risk of acute stroke being converted to hemorrhagic stroke with anticoagulation Code Status:   Full code Family Communication:  Diagnosis plan discussed with patient.  Patient verbalized understanding and agrees with plan.  Questions answered.  Further recommendations  to follow as clinically indicated  Disposition Plan:   Patient is from:  Home  Anticipated DC to:  Home  Anticipated DC date:  Anticipated discharge home once echocardiogram and work-up complete tonight or tomorrow morning  Anticipated DC barriers: No barriers to discharge identified  Consults called:  Neurology, Dr. Laurence Slate Admission status:  Observation  Severity of Illness: The appropriate patient status for this patient is OBSERVATION. Observation status is judged to be reasonable and necessary in order to provide the required intensity of service to ensure the patient's safety. The patient's presenting symptoms, physical exam findings, and initial radiographic and laboratory data in the context of their medical condition is felt to place them at decreased risk for further clinical deterioration. Furthermore, it is anticipated that the patient will be medically stable for discharge from the hospital  within 2 midnights of admission. The following factors support the patient status of observation.   " The patient's presenting symptoms include right-sided weakness and numbness with dizziness.. " The physical exam findings include no focal neurological deficits at this time. " The initial radiographic and laboratory data are cute stroke on MRI brain.      Claudean Severance Renee Erb MD [facility]  How to contact the [facility] Attending or Consulting provider 7A - 7P or covering provider during after hours 7P -7A, for this patient?   1. Check the care team in [facility] and look for a) attending/consulting [facility] provider listed and b) the [facility] team listed 2. Log into [email or web address] and use [place]'s universal password to access. If you do not have the password, please contact the hospital operator. 3. Locate the [facility] provider you are looking for under [facility] and page to a number that you can be directly reached. 4. If you still have difficulty reaching the provider, please page the [facility]  (Director on Call) for the Hospitalists listed on amion for assistance.  07/23/2019, 2:32 AM

## 2019-07-23 NOTE — Consult Note (Signed)
Requesting Physician: Dr.  Jacqulyn Bath  Chief Complaint: Dizziness, right-sided numbness  History obtained from: Patient and Chart   HPI:                                                                                                                                       Lindsay Benitez is a 64 y.o. female with past medical history significant for hypertension noncompliant with meds presents to the emergency department after developing sudden onset dizziness, right arm and leg numbness around 8 PM. Patient states that since Tuesday she has been having right-sided numbness on and off and presented to the ED yesterday however left after waiting in the emergency room for 8 hours.  On arrival to [facility] ED, due to sudden onset of stroke symptoms EDP activated code stroke.  Patient had no weakness.  NIH stroke scale was 1 for numbness.  Had minimal difficulty walking.  tPA was not administered due to improving mild and nondisabling symptoms as well as unclear time of actual onset due to waxing and waning of symptoms  Date last known well: 7.6.21 vs 07/22/2019 tPA Given: No,  NIHSS: 1 Baseline MRS 0  Past Medical History:  Diagnosis Date   Hypertension     Past Surgical History:  Procedure Laterality Date   CESAREAN SECTION      No family history on file. Social History:  reports that she does not drink alcohol. No history on file for tobacco use and drug use.  Allergies: No Known Allergies  Medications:                                                                                                                        I reviewed home medications   ROS:  14 systems reviewed and negative except above    Examination:                                                                                                      General: Appears well-developed  Psych:  Affect appropriate to situation Eyes: No scleral injection HENT: No OP obstrucion Head: Normocephalic.  Cardiovascular: Normal rate and regular rhythm.  Respiratory: Effort normal and breath sounds normal to anterior ascultation GI: Soft.  No distension. There is no tenderness.  Skin: WDI    Neurological Examination Mental Status: Alert, oriented, thought content appropriate.  Speech fluent without evidence of aphasia. Able to follow 3 step commands without difficulty. Cranial Nerves: II: Visual fields grossly normal,  III,IV, VI: ptosis not present, extra-ocular motions intact bilaterally, pupils equal, round, reactive to light and accommodation V,VII: smile symmetric, facial light touch sensation normal bilaterally VIII: hearing normal bilaterally IX,X: uvula rises symmetrically XI: bilateral shoulder shrug XII: midline tongue extension Motor: Right : Upper extremity   5/5    Left:     Upper extremity   5/5  Lower extremity   5/5     Lower extremity   5/5 Tone and bulk:normal tone throughout; no atrophy noted Sensory: Reduced sensation on the right arm and leg Deep Tendon Reflexes: 2+ and symmetric throughout Plantars: Right: downgoing   Left: downgoing Cerebellar: normal finger-to-nose, Gait: Slightly ataxic   Lab Results: Basic Metabolic Panel: Recent Labs  Lab 07/21/19 1959 07/22/19 2233 07/22/19 2246  NA 140 142 141  K 3.8 3.9 3.7  CL 104 105 101  CO2 25 26  --   GLUCOSE 98 156* 153*  BUN 7* 7* 9  CREATININE 0.78 0.86 0.80  CALCIUM 9.9 9.4  --     CBC: Recent Labs  Lab 07/21/19 1959 07/22/19 2233 07/22/19 2246  WBC 9.6 9.5  --   NEUTROABS  --  4.5  --   HGB 12.9 12.1 11.9*  HCT 36.6 34.7* 35.0*  MCV 78.7* 78.9*  --   PLT 388 381  --     Coagulation Studies: Recent Labs    07/22/19 2231-05-15  LABPROT 11.6  INR 0.9    Imaging: CT HEAD CODE STROKE WO CONTRAST`  Result Date: 07/22/2019 CLINICAL DATA:  Code stroke.  Right-sided numbness EXAM: CT  HEAD WITHOUT CONTRAST TECHNIQUE: Contiguous axial images were obtained from the base of the skull through the vertex without intravenous contrast. COMPARISON:  None. FINDINGS: Brain: There is no mass, hemorrhage or extra-axial collection. The size and configuration of the ventricles and extra-axial CSF spaces are normal. The brain parenchyma is normal, without evidence of acute or chronic infarction. There is dense calcification within the basal ganglia, dentate nuclei and both occipital lobes. Vascular: No abnormal hyperdensity of the major intracranial arteries or dural venous sinuses. No intracranial atherosclerosis. Skull: The visualized skull base, calvarium and extracranial soft tissues are normal. Sinuses/Orbits: No fluid levels or advanced mucosal thickening of the visualized paranasal sinuses. No mastoid or middle ear effusion. The orbits are normal. ASPECTS [facility] Stroke Program Early CT  Score) - Ganglionic level infarction (caudate, lentiform nuclei, internal capsule, insula, M1-M3 cortex): 7 - Supraganglionic infarction (M4-M6 cortex): 3 Total score (0-10 with 10 being normal): 10 IMPRESSION: 1. No acute intracranial abnormality. 2. ASPECTS is 10. 3. Dense calcification of the basal ganglia, dentate nuclei and both occipital lobes. 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 4. These results were communicated to Dr. Georgiana Spinner Kimberlyn Quiocho at 10:53 pm on 07/22/2019 by text page via the [facility] messaging system. Electronically Signed   By: Deatra Robinson M.D.   On: 07/22/2019 22:56     ASSESSMENT AND PLAN  64 year old female with hypertension presents with right-sided numbness and ataxia, waxing and waning since last week.   Acute Ischemic Stroke   Risk factors: Hypertension Etiology: Small vessel disease  Recommend # MRI of the brain without contrast #MRA Head and neck  #Transthoracic Echo  # Start patient on aspirin 81 mg and 75 mg of Plavix daily to 3  weeks then aspirin alone, loaded with 300 mg Plavix #Start or continue Atorvastatin 80 mg/other high intensity statin # BP goal: permissive HTN upto 220/120 mmHg  # HBAIC and Lipid profile # Telemetry monitoring # Frequent neuro checks # NPO until passes stroke swallow screen  Please page stroke NP  Or  PA  Or MD from 8am -4 pm  as this patient from this time will be  followed by the stroke.   You can look them up on [email or web address]  Password [facility]      Kimika Streater [facility] Pager Number [phone]

## 2019-07-23 NOTE — Discharge Summary (Signed)
Physician Discharge Summary  Lindsay Benitez EAV:[id number] DOB: 1955/07/03 DOA: 07/22/2019  PCP: Patient, No Pcp Per  Admit date: 07/22/2019 Discharge date: 07/23/2019  Admitted From: Home Disposition: Home  Recommendations for Outpatient Follow-up:  1. Follow up with PCP in 1-2 weeks 2. Neurology office will call you for follow-up   Discharge Condition: Stable CODE STATUS: Full code Diet recommendation: Low-salt diet  Discharge summary: 64 year old female no medical problems, history of hypertension untreated presented with 3 days of on and off right hand weakness and numbness.  MRI showed left caudate lobe infarct.  Out of TPA window.  She stayed in ER and finished investigations.  Currently with no neurological deficit.  Clinical findings, intermittent right hand numbness currently no neurological symptoms. MRI of the brain, left caudate lobe infarct. MRA head and neck, moderate left P2 stenosis. 2D echocardiogram, normal ejection fraction.  No source of emboli. Antiplatelet therapy, not taking any medicine at home.  Started on aspirin Plavix for 3 weeks then aspirin alone. LDL is 203, atorvastatin 80 mg daily. Hemoglobin A1c, pending.  Will follow up results. Untreated hypertension, however mildly elevated.  Will start patient on amlodipine.  Patient is neurologically stable.  Hemoglobin A1c is pending.  She is a stabilized.  She can go home today.  To follow-up with neurology.    Discharge Diagnoses:  Principal Problem:   Acute CVA (cerebrovascular accident) [facility]) Active Problems:   Essential hypertension    Discharge Instructions  Discharge Instructions    Ambulatory referral to Neurology   Complete by: As directed    Follow up with stroke clinic NP (Jessica Vanschaick or Darrol Angel, if both not available, consider Manson Allan, or Ahern) at [facility] in about 4 weeks. Thanks.   Diet - low sodium heart healthy   Complete by: As directed    Increase activity  slowly   Complete by: As directed      Allergies as of 07/23/2019   No Known Allergies     Medication List    TAKE these medications   amLODipine 5 MG tablet Commonly known as: NORVASC Take 1 tablet (5 mg total) by mouth daily. Start taking on: July 24, 2019   aspirin 81 MG EC tablet Take 1 tablet (81 mg total) by mouth daily. Swallow whole. Start taking on: July 24, 2019   atorvastatin 80 MG tablet Commonly known as: LIPITOR Take 1 tablet (80 mg total) by mouth daily. Start taking on: July 24, 2019   clopidogrel 75 MG tablet Commonly known as: PLAVIX Take 1 tablet (75 mg total) by mouth daily for 21 days. Start taking on: July 24, 2019       Follow-up Information    [facility]. Schedule an appointment as soon as possible for a visit in 4 week(s).   Specialty: Neurology Contact information: [address] [place] [address] [phone]             No Known Allergies  Consultations:  Neurology   Procedures/Studies: MR ANGIO HEAD WO CONTRAST  Result Date: 07/23/2019 CLINICAL DATA:  Transient ischemic attack EXAM: MR HEAD WITHOUT CONTRAST MR CIRCLE OF WILLIS WITHOUT CONTRAST MRA OF THE NECK WITHOUT AND WITH CONTRAST TECHNIQUE: Multiplanar, multiecho pulse sequences of the brain, circle of willis and surrounding structures were obtained without intravenous contrast. Angiographic images of the neck were obtained using MRA technique without and with intravenous contrast. CONTRAST:  7.58mL GADAVIST GADOBUTROL 1 MMOL/ML IV SOLN COMPARISON:  None. FINDINGS: MRI HEAD FINDINGS  BRAIN: The midline structures are normal. Small acute infarct of the left caudate tail. 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 The CSF spaces are normal for age, with no hydrocephalus. Blood-sensitive sequences show no chronic microhemorrhage or superficial siderosis. SKULL AND UPPER CERVICAL SPINE:  The visualized skull base, calvarium, upper cervical spine and extracranial soft tissues are normal. SINUSES/ORBITS: No fluid levels or advanced mucosal thickening. No mastoid or middle ear effusion. The orbits are normal. MRA HEAD FINDINGS POSTERIOR CIRCULATION: --Basilar artery: Normal. 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 Normal right PCA. --Superior cerebellar arteries: Normal. --Inferior cerebellar arteries: Normal anterior and posterior inferior cerebellar arteries. ANTERIOR CIRCULATION: --Intracranial internal carotid arteries: Normal. --Anterior cerebral arteries: Normal. Both A1 segments are present. Patent anterior communicating artery. --Middle cerebral arteries: Normal. --Posterior communicating arteries: Absent MRA NECK FINDINGS Aortic arch: Normal 3 vessel aortic branching pattern. The visualized subclavian arteries are normal. Right carotid system: Normal course and caliber without stenosis or evidence of dissection. Left carotid system: Normal course and caliber without stenosis or evidence of dissection. Vertebral arteries: Left dominant. Vertebral artery origins are normal. Vertebral arteries are normal in course and caliber to the vertebrobasilar confluence without stenosis or evidence of dissection. IMPRESSION: 1. Small acute infarct of the left caudate tail. No hemorrhage or mass effect. 2. 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 Otherwise normal intracranial MRA. 3. Normal MRA of the neck. Electronically Signed   By: Deatra Robinson M.D.   On: 07/23/2019 01:08   MR Angiogram Neck W or Wo Contrast  Result Date: 07/23/2019 CLINICAL DATA:  Transient ischemic attack EXAM: MR HEAD WITHOUT CONTRAST MR CIRCLE OF WILLIS WITHOUT CONTRAST MRA OF THE NECK WITHOUT AND WITH CONTRAST TECHNIQUE: Multiplanar, multiecho pulse sequences of the brain, circle of willis and surrounding structures were obtained without intravenous contrast. Angiographic  images of the neck were obtained using MRA technique without and with intravenous contrast. CONTRAST:  7.73mL GADAVIST GADOBUTROL 1 MMOL/ML IV SOLN COMPARISON:  None. FINDINGS: MRI HEAD FINDINGS BRAIN: The midline structures are normal. Small acute infarct of the left caudate tail. 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 The CSF spaces are normal for age, with no hydrocephalus. Blood-sensitive sequences show no chronic microhemorrhage or superficial siderosis. SKULL AND UPPER CERVICAL SPINE: The visualized skull base, calvarium, upper cervical spine and extracranial soft tissues are normal. SINUSES/ORBITS: No fluid levels or advanced mucosal thickening. No mastoid or middle ear effusion. The orbits are normal. MRA HEAD FINDINGS POSTERIOR CIRCULATION: --Basilar artery: Normal. 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 Normal right PCA. --Superior cerebellar arteries: Normal. --Inferior cerebellar arteries: Normal anterior and posterior inferior cerebellar arteries. ANTERIOR CIRCULATION: --Intracranial internal carotid arteries: Normal. --Anterior cerebral arteries: Normal. Both A1 segments are present. Patent anterior communicating artery. --Middle cerebral arteries: Normal. --Posterior communicating arteries: Absent MRA NECK FINDINGS Aortic arch: Normal 3 vessel aortic branching pattern. The visualized subclavian arteries are normal. Right carotid system: Normal course and caliber without stenosis or evidence of dissection. Left carotid system: Normal course and caliber without stenosis or evidence of dissection. Vertebral arteries: Left dominant. Vertebral artery origins are normal. Vertebral arteries are normal in course and caliber to the vertebrobasilar confluence without stenosis or evidence of dissection. IMPRESSION: 1. Small acute infarct of the left caudate tail. No hemorrhage or mass effect. 2. 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 Otherwise normal intracranial MRA. 3.  Normal MRA of the neck. Electronically Signed   By: Deatra RobinsonKevin  Herman M.D.   On: 07/23/2019 01:08   MR BRAIN WO CONTRAST  Result Date: 07/23/2019 CLINICAL DATA:  Transient ischemic attack EXAM: MR HEAD WITHOUT CONTRAST MR CIRCLE OF WILLIS WITHOUT CONTRAST MRA OF THE NECK WITHOUT AND WITH CONTRAST TECHNIQUE: Multiplanar, multiecho pulse sequences of the brain, circle of willis and surrounding structures were obtained without intravenous contrast. Angiographic images of the neck were obtained using MRA technique without and with intravenous contrast. CONTRAST:  7.255mL GADAVIST GADOBUTROL 1 MMOL/ML IV SOLN COMPARISON:  None. FINDINGS: MRI HEAD FINDINGS BRAIN: The midline structures are normal. Small acute infarct of the left caudate tail. 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 The CSF spaces are normal for age, with no hydrocephalus. Blood-sensitive sequences show no chronic microhemorrhage or superficial siderosis. SKULL AND UPPER CERVICAL SPINE: The visualized skull base, calvarium, upper cervical spine and extracranial soft tissues are normal. SINUSES/ORBITS: No fluid levels or advanced mucosal thickening. No mastoid or middle ear effusion. The orbits are normal. MRA HEAD FINDINGS POSTERIOR CIRCULATION: --Basilar artery: Normal. 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 Normal right PCA. --Superior cerebellar arteries: Normal. --Inferior cerebellar arteries: Normal anterior and posterior inferior cerebellar arteries. ANTERIOR CIRCULATION: --Intracranial internal carotid arteries: Normal. --Anterior cerebral arteries: Normal. Both A1 segments are present. Patent anterior communicating artery. --Middle cerebral arteries: Normal. --Posterior communicating arteries: Absent MRA NECK FINDINGS Aortic arch: Normal 3 vessel aortic branching pattern.  The visualized subclavian arteries are normal. Right carotid system: Normal course and caliber without stenosis or evidence of dissection. Left carotid system: Normal course and caliber without stenosis or evidence of dissection. Vertebral arteries: Left dominant. Vertebral artery origins are normal. Vertebral arteries are normal in course and caliber to the vertebrobasilar confluence without stenosis or evidence of dissection. IMPRESSION: 1. Small acute infarct of the left caudate tail. No hemorrhage or mass effect. 2. 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 Otherwise normal intracranial MRA. 3. Normal MRA of the neck. Electronically Signed   By: Deatra RobinsonKevin  Herman M.D.   On: 07/23/2019 01:08   ECHOCARDIOGRAM COMPLETE  Result Date: 07/23/2019    ECHOCARDIOGRAM REPORT   Patient Name:   Sammuel HinesNNIE Salle Date of Exam: 07/23/2019 Medical Rec #:  [id number]     Height:       62.0 in Accession #:    [id number]    Weight:       170.0 lb Date of Birth:  1955-12-12    BSA:          1.784 m Patient Age:    63 years      BP:           121/82 mmHg Patient Gender: F             HR:           89 bpm. Exam Location:  Inpatient Procedure: 2D Echo Indications:    stroke 434.91  History:        Patient has no prior history of Echocardiogram examinations.                 Risk Factors:Hypertension.  Sonographer:    Celene SkeenVijay Shankar RDCS (AE) Referring Phys: [id number] Claudean SeveranceBRADLEY S CHOTINER IMPRESSIONS  1. Left ventricular ejection fraction, by estimation, is 60 to 65%. The left ventricle has normal function. The left ventricle has no regional wall motion abnormalities. Left ventricular diastolic parameters were normal.  2. Right ventricular systolic function is normal. The right ventricular size is normal. Tricuspid regurgitation signal is inadequate for assessing PA pressure.  3. The mitral valve is normal in structure. No evidence of mitral valve regurgitation. No evidence of mitral stenosis.  4. The aortic valve is tricuspid. Aortic valve  regurgitation is not visualized. Mild to moderate aortic valve sclerosis/calcification is present, without any evidence of aortic stenosis.  5. The inferior vena cava is normal in size with greater than 50% respiratory variability, suggesting right atrial pressure of 3 mmHg. FINDINGS  Left Ventricle: Left ventricular ejection fraction, by estimation, is 60 to 65%. The left ventricle has normal function. The left ventricle has no regional wall motion abnormalities. The left ventricular internal cavity size was normal in size. There is  no left ventricular hypertrophy. Left ventricular diastolic parameters were normal. Right Ventricle: The right ventricular size is normal.Right ventricular systolic function is normal. Tricuspid regurgitation signal is inadequate for assessing PA pressure. The tricuspid regurgitant velocity is 2.90 m/s, and with an assumed right atrial pressure of 3 mmHg, the estimated right ventricular systolic pressure is 36.6 mmHg. Left Atrium: Left atrial size was normal in size. Right Atrium: Right atrial size was normal in size. Pericardium: There is no evidence of pericardial effusion. Mitral Valve: The mitral valve is normal in structure. Normal mobility of the mitral valve leaflets. No evidence of mitral valve regurgitation. No evidence of mitral valve stenosis. Tricuspid Valve: The tricuspid valve is normal in structure. Tricuspid valve regurgitation is trivial. No evidence of tricuspid stenosis. Aortic Valve: The aortic valve is tricuspid. Aortic valve regurgitation is not visualized. Mild to moderate aortic valve sclerosis/calcification is present, without any evidence of aortic stenosis. Pulmonic Valve: The pulmonic valve was normal in structure. Pulmonic valve regurgitation is not visualized. No evidence of pulmonic stenosis. Aorta: The aortic root is normal in size and structure. Venous: The inferior vena cava is normal in size with greater than 50% respiratory variability, suggesting  right atrial pressure of 3 mmHg.  LEFT VENTRICLE PLAX 2D LVIDd:         3.80 cm  Diastology LVIDs:         2.40 cm  LV e' lateral:   10.00 cm/s LV PW:         1.00 cm  LV E/e' lateral: 8.9 LV IVS:        1.10 cm  LV e' medial:    10.10 cm/s LVOT diam:     1.90 cm  LV E/e' medial:  8.8 LV SV:         62 LV SV Index:   34 LVOT Area:     2.84 cm  RIGHT VENTRICLE RV S prime:     11.70 cm/s TAPSE (M-mode): 2.4 cm LEFT ATRIUM             Index       RIGHT ATRIUM           Index LA diam:        3.60 cm 2.02 cm/m  RA Area:     13.40 cm LA Vol (A2C):   38.9 ml 21.80 ml/m RA Volume:   27.80 ml  15.58 ml/m LA Vol (A4C):   36.5 ml 20.46 ml/m LA Biplane Vol: 39.4 ml 22.08 ml/m  AORTIC VALVE LVOT Vmax:   90.80 cm/s LVOT Vmean:  67.300 cm/s LVOT VTI:    0.217 m  AORTA Ao Root diam: 3.00 cm MITRAL VALVE  TRICUSPID VALVE MV Area (PHT): 3.42 cm    TR Peak grad:   33.6 mmHg MV Decel Time: 222 msec    TR Vmax:        290.00 cm/s MV E velocity: 88.60 cm/s MV A velocity: 87.00 cm/s  SHUNTS MV E/A ratio:  1.02        Systemic VTI:  0.22 m                            Systemic Diam: 1.90 cm Olga Millers MD Electronically signed by Olga Millers MD Signature Date/Time: 07/23/2019/4:29:01 PM    Final    CT HEAD CODE STROKE WO CONTRAST`  Result Date: 07/22/2019 CLINICAL DATA:  Code stroke.  Right-sided numbness EXAM: CT HEAD WITHOUT CONTRAST TECHNIQUE: Contiguous axial images were obtained from the base of the skull through the vertex without intravenous contrast. COMPARISON:  None. FINDINGS: Brain: There is no mass, hemorrhage or extra-axial collection. The size and configuration of the ventricles and extra-axial CSF spaces are normal. The brain parenchyma is normal, without evidence of acute or chronic infarction. There is dense calcification within the basal ganglia, dentate nuclei and both occipital lobes. Vascular: No abnormal hyperdensity of the major intracranial arteries or dural venous sinuses. No  intracranial atherosclerosis. Skull: The visualized skull base, calvarium and extracranial soft tissues are normal. Sinuses/Orbits: No fluid levels or advanced mucosal thickening of the visualized paranasal sinuses. No mastoid or middle ear effusion. The orbits are normal. ASPECTS [facility] Stroke Program Early CT Score) - Ganglionic level infarction (caudate, lentiform nuclei, internal capsule, insula, M1-M3 cortex): 7 - Supraganglionic infarction (M4-M6 cortex): 3 Total score (0-10 with 10 being normal): 10 IMPRESSION: 1. No acute intracranial abnormality. 2. ASPECTS is 10. 3. Dense calcification of the basal ganglia, dentate nuclei and both occipital lobes. 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 4. These results were communicated to Dr. Georgiana Spinner Aroor at 10:53 pm on 07/22/2019 by text page via the [facility] messaging system. Electronically Signed   By: Deatra Robinson M.D.   On: 07/22/2019 22:56   (Echo, Carotid, EGD, Colonoscopy, ERCP)    Subjective: Patient seen and examined.  Still in the emergency room.  Walking around with no deficits.  Denies any complaints.   Discharge Exam: Vitals:   07/23/19 1051 07/23/19 1104  BP: (!) 147/79 (!) 147/79  Pulse: 60 65  Resp:  16  Temp:  (!) 97.3 F (36.3 C)  SpO2: 97% 95%   Vitals:   07/23/19 0600 07/23/19 0730 07/23/19 1051 07/23/19 1104  BP: 121/69 117/73 (!) 147/79 (!) 147/79  Pulse: 61 60 60 65  Resp: Temp:    (!) 97.3 F (36.3 C)  TempSrc:    Temporal  SpO2: 100% 93% 97% 95%  Weight:      Height:        General: Pt is alert, awake, not in acute distress Cardiovascular: RRR, S1/S2 +, no rubs, no gallops Respiratory: CTA bilaterally, no wheezing, no rhonchi Abdominal: Soft, NT, ND, bowel sounds + Extremities: no edema, no cyanosis    The results of significant diagnostics from this hospitalization (including imaging, microbiology, ancillary and laboratory) are listed below for  reference.     Microbiology: Recent Results (from the past 240 hour(s))  SARS Coronavirus 2 by RT PCR (hospital order, performed in [facility] hospital lab) Nasopharyngeal Nasopharyngeal Swab  Status: None   Collection Time: 07/23/19  1:27 AM   Specimen: Nasopharyngeal Swab  Result Value Ref Range Status   SARS Coronavirus 2 NEGATIVE NEGATIVE Final    Comment: (NOTE) SARS-CoV-2 target nucleic acids are NOT DETECTED.  The SARS-CoV-2 RNA is generally detectable in upper and lower respiratory specimens during the acute phase of infection. The lowest concentration of SARS-CoV-2 viral copies this assay can detect is 250 copies / mL. A negative result does not preclude SARS-CoV-2 infection and should not be used as the sole basis for treatment or other patient management decisions.  A negative result may occur with improper specimen collection / handling, submission of specimen other than nasopharyngeal swab, presence of viral mutation(s) within the areas targeted by this assay, and inadequate number of viral copies (<250 copies / mL). A negative result must be combined with clinical observations, patient history, and epidemiological information.  Fact Sheet for Patients:   BoilerBrush.com.cy  Fact Sheet for Healthcare Providers: https://pope.com/  This test is not yet approved or  cleared by the [place] FDA and has been authorized for detection and/or diagnosis of SARS-CoV-2 by FDA under an Emergency Use Authorization (EUA).  This EUA will remain in effect (meaning this test can be used) for the duration of the COVID-19 declaration under Section 564(b)(1) of the Act, 21 U.S.C. section 360bbb-3(b)(1), unless the authorization is terminated or revoked sooner.  Performed at [facility], [address] [place], [place] [address]      Labs: BNP (last 3 results) No results for input(s): BNP in the last 8760  hours. Basic Metabolic Panel: Recent Labs  Lab 07/21/19 1959 07/22/19 2233 07/22/19 2246 07/23/19 0832  NA 140 142 141  --   K 3.8 3.9 3.7  --   CL 104 105 101  --   CO2 25 26  --   --   GLUCOSE 98 156* 153*  --   BUN 7* 7* 9  --   CREATININE 0.78 0.86 0.80  --   CALCIUM 9.9 9.4  --   --   MG  --   --   --  1.9  PHOS  --   --   --  3.6   Liver Function Tests: Recent Labs  Lab 07/22/19 2233  AST 20  ALT 14  ALKPHOS 76  BILITOT 0.7  PROT 6.8  ALBUMIN 3.7   No results for input(s): LIPASE, AMYLASE in the last 168 hours. No results for input(s): AMMONIA in the last 168 hours. CBC: Recent Labs  Lab 07/21/19 1959 07/22/19 2233 07/22/19 2246  WBC 9.6 9.5  --   NEUTROABS  --  4.5  --   HGB 12.9 12.1 11.9*  HCT 36.6 34.7* 35.0*  MCV 78.7* 78.9*  --   PLT 388 381  --    Cardiac Enzymes: No results for input(s): CKTOTAL, CKMB, CKMBINDEX, TROPONINI in the last 168 hours. BNP: Invalid input(s): POCBNP CBG: Recent Labs  Lab 07/21/19 1816  GLUCAP 98   D-Dimer No results for input(s): DDIMER in the last 72 hours. Hgb A1c No results for input(s): HGBA1C in the last 72 hours. Lipid Profile Recent Labs    07/23/19 0249  CHOL 294*  HDL 79  LDLCALC 203*  TRIG 58  CHOLHDL 3.7   Thyroid function studies No results for input(s): TSH, T4TOTAL, T3FREE, THYROIDAB in the last 72 hours.  Invalid input(s): FREET3 Anemia work up No results for input(s): VITAMINB12, FOLATE, FERRITIN, TIBC, IRON, RETICCTPCT  in the last 72 hours. Urinalysis    Component Value Date/Time   COLORURINE COLORLESS (A) 07/21/2019 2027   APPEARANCEUR CLEAR 07/21/2019 2027   LABSPEC 1.001 (L) 07/21/2019 2027   PHURINE 6.0 07/21/2019 2027   GLUCOSEU NEGATIVE 07/21/2019 2027   HGBUR SMALL (A) 07/21/2019 2027   BILIRUBINUR NEGATIVE 07/21/2019 2027   KETONESUR NEGATIVE 07/21/2019 2027   PROTEINUR NEGATIVE 07/21/2019 2027   NITRITE NEGATIVE 07/21/2019 2027   LEUKOCYTESUR NEGATIVE 07/21/2019  2027   Sepsis Labs Invalid input(s): PROCALCITONIN,  WBC,  LACTICIDVEN Microbiology Recent Results (from the past 240 hour(s))  SARS Coronavirus 2 by RT PCR (hospital order, performed in [facility] hospital lab) Nasopharyngeal Nasopharyngeal Swab     Status: None   Collection Time: 07/23/19  1:27 AM   Specimen: Nasopharyngeal Swab  Result Value Ref Range Status   SARS Coronavirus 2 NEGATIVE NEGATIVE Final    Comment: (NOTE) SARS-CoV-2 target nucleic acids are NOT DETECTED.  The SARS-CoV-2 RNA is generally detectable in upper and lower respiratory specimens during the acute phase of infection. The lowest concentration of SARS-CoV-2 viral copies this assay can detect is 250 copies / mL. A negative result does not preclude SARS-CoV-2 infection and should not be used as the sole basis for treatment or other patient management decisions.  A negative result may occur with improper specimen collection / handling, submission of specimen other than nasopharyngeal swab, presence of viral mutation(s) within the areas targeted by this assay, and inadequate number of viral copies (<250 copies / mL). A negative result must be combined with clinical observations, patient history, and epidemiological information.  Fact Sheet for Patients:   BoilerBrush.com.cy  Fact Sheet for Healthcare Providers: https://pope.com/  This test is not yet approved or  cleared by the [place] FDA and has been authorized for detection and/or diagnosis of SARS-CoV-2 by FDA under an Emergency Use Authorization (EUA).  This EUA will remain in effect (meaning this test can be used) for the duration of the COVID-19 declaration under Section 564(b)(1) of the Act, 21 U.S.C. section 360bbb-3(b)(1), unless the authorization is terminated or revoked sooner.  Performed at [facility], [address], [place], [place] [address]      Time coordinating discharge:   32 minutes  SIGNED:   Dorcas Carrow, MD  [facility] 07/23/2019, 5:15 PM

## 2019-07-23 NOTE — Evaluation (Signed)
Occupational Therapy Evaluation Patient Details Name: Lindsay Benitez MRN: [id number] DOB: 1955-10-18 Today's Date: 07/23/2019    History of Present Illness 64 yo female with on set of R side weakness/ tingling. Pt seen at urgent care 7/10 referred to ER and left after waiting 8 hours. Pt returns due to recurrence of symtoms (dizziness R arm and leg numbness). NIH 1 MRI small ischemia in L caudate tail PMH HTN   Clinical Impression   Patient evaluated by Occupational Therapy with no further acute OT needs identified. All education has been completed and the patient has no further questions. See below for any follow-up Occupational Therapy or equipment needs. OT to sign off. Thank you for referral.   Reviewed all signs and symptoms of a stroke with stroke booklet in room. Pt without further questions at this time.     Follow Up Recommendations  No OT follow up    Equipment Recommendations  None recommended by OT    Recommendations for Other Services       Precautions / Restrictions Precautions Precautions: None      Mobility Bed Mobility Overal bed mobility: Independent                Transfers Overall transfer level: Independent                    Balance Overall balance assessment: Independent                               Standardized Balance Assessment Standardized Balance Assessment : Berg Balance Test;Dynamic Gait Index Berg Balance Test Sit to Stand: Able to stand without using hands and stabilize independently Standing Unsupported: Able to stand safely 2 minutes Sitting with Back Unsupported but Feet Supported on Floor or Stool: Able to sit safely and securely 2 minutes Stand to Sit: Sits safely with minimal use of hands Transfers: Able to transfer safely, minor use of hands Dynamic Gait Index Level Surface: Normal Change in Gait Speed: Normal Gait with Horizontal Head Turns: Normal Gait with Vertical Head Turns: Normal Gait and  Pivot Turn: Normal Step Over Obstacle: Normal Step Around Obstacles: Normal Steps:  (no steps in the ED)     ADL either performed or assessed with clinical judgement   ADL Overall ADL's : Modified independent                                             Vision Baseline Vision/History: No visual deficits       Perception     Praxis      Pertinent Vitals/Pain Pain Assessment: No/denies pain     Hand Dominance Right   Extremity/Trunk Assessment Upper Extremity Assessment Upper Extremity Assessment: RUE deficits/detail;LUE deficits/detail RUE Deficits / Details: reports having injections in shoulder due to rotator cuff issues but AROM shoulder 100 degrees LUE Deficits / Details: reports having injections in shoulder due to rotator cuff issues but AROM shoulder 100 degrees   Lower Extremity Assessment Lower Extremity Assessment: Defer to PT evaluation   Cervical / Trunk Assessment Cervical / Trunk Assessment: Normal   Communication Communication Communication: No difficulties   Cognition Arousal/Alertness: Awake/alert Behavior During Therapy: WFL for tasks assessed/performed Overall Cognitive Status: Within Functional Limits for tasks assessed  General Comments       Exercises     Shoulder Instructions      Home Living Family/patient expects to be discharged to:: Private residence Living Arrangements: Children (38 yo son) Available Help at Discharge: Family;Available PRN/intermittently Type of Home: House Home Access: Level entry     Home Layout: One level     Bathroom Shower/Tub: Chief Strategy Officer: Standard     Home Equipment: None   Additional Comments: 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 pt reports standing for for this task       Prior Functioning/Environment Level of Independence: Independent                 OT Problem List:         OT Treatment/Interventions:      OT Goals(Current goals can be found in the care plan section) Acute Rehab OT Goals Patient Stated Goal: to return home  OT Frequency:     Barriers to D/C:            Co-evaluation              AM-PAC OT "6 Clicks" Daily Activity     Outcome Measure Help from another person eating meals?: None Help from another person taking care of personal grooming?: None Help from another person toileting, which includes using toliet, bedpan, or urinal?: None Help from another person bathing (including washing, rinsing, drying)?: None Help from another person to put on and taking off regular upper body clothing?: None Help from another person to put on and taking off regular lower body clothing?: None 6 Click Score: 24   End of Session Equipment Utilized During Treatment: Gait belt Nurse Communication: Mobility status;Precautions  Activity Tolerance: Patient tolerated treatment well Patient left: in bed;with call bell/phone within reach  OT Visit Diagnosis: Unsteadiness on feet (R26.81)                Time: 2409-7353 OT Time Calculation (min): 24 min Charges:  OT General Charges $OT Visit: 1 Visit OT Evaluation $OT Eval Moderate Complexity: 1 Mod   Brynn, OTR/L  Acute Rehabilitation Services Pager: [phone] Office: [phone] .   Mateo Flow 07/23/2019, 8:22 AM

## 2019-07-23 NOTE — Progress Notes (Signed)
Patient seen and examined.  Admitted by nighttime hospitalist early morning hours for 3 days of intermittent right hand weakness and numbness.  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  Currently with no neurological deficits.  Plan: MRI of the brain/MRA of the head and neck done. 2D echocardiogram pending. LDL is 203, A1c pending. She is waiting for inpatient bed assignment.  If 2D echocardiogram can be done and stroke plan finalized, will discharge her today with aspirin and Plavix for 3 weeks and then aspirin alone.  Started on high intensity statin.  Will start patient on blood pressure medications starting tomorrow.

## 2019-07-24 LAB — CALCITONIN: Calcitonin: <2 pg/mL (ref 0.0–5.0)

## 2019-07-24 LAB — HEMOGLOBIN A1C
Hgb A1c MFr Bld: 5.2 % (ref 4.8–5.6)
Mean Plasma Glucose: 103 mg/dL

## 2019-07-24 LAB — PARATHYROID HORMONE, INTACT (NO CA): PTH: 24 pg/mL (ref 15–65)

## 2019-08-22 ENCOUNTER — Encounter: Payer: Self-pay | Admitting: Adult Health

## 2019-08-22 ENCOUNTER — Ambulatory Visit (INDEPENDENT_AMBULATORY_CARE_PROVIDER_SITE_OTHER): Payer: BC Managed Care – PPO | Admitting: Adult Health

## 2019-08-22 VITALS — BP 150/90 | HR 81 | Ht 62.0 in | Wt 173.2 lb

## 2019-08-22 DIAGNOSIS — R531 Weakness: Secondary | ICD-10-CM | POA: Diagnosis not present

## 2019-08-22 DIAGNOSIS — G9389 Other specified disorders of brain: Secondary | ICD-10-CM | POA: Diagnosis not present

## 2019-08-22 DIAGNOSIS — I10 Essential (primary) hypertension: Secondary | ICD-10-CM | POA: Diagnosis not present

## 2019-08-22 DIAGNOSIS — I639 Cerebral infarction, unspecified: Secondary | ICD-10-CM | POA: Diagnosis not present

## 2019-08-22 DIAGNOSIS — E785 Hyperlipidemia, unspecified: Secondary | ICD-10-CM

## 2019-08-22 NOTE — Progress Notes (Signed)
[facility] [address] [place]. [place] [address] [phone]       HOSPITAL FOLLOW UP NOTE  [name] Date of Birth:  10-22-1955 Medical Record Number:  [id number]   Reason for Referral:  hospital stroke follow up    SUBJECTIVE:   CHIEF COMPLAINT:  Chief Complaint  Patient presents with  . Hospitalization Follow-up    F/U after stroke, states she is still having dizzy spells from time to time. Still fatigued.   . room 9    alone     HPI:   [name] is a 64 y.o. female with history of HTN  who presented on 07/22/2019 with dizziness, recurrent R arm and leg numbness since 7/6.  Stroke work-up revealed left BG/CR infarct secondary to small vessel disease source.  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  No prior neuro deficit and recommend follow-up as outpatient.  Recommended DAPT for 3 weeks and aspirin alone.  History of HTN without ongoing compliance since 12/2018.  LDL 203 initiate atorvastatin 80 mg daily.  Other stroke risk factors include obesity.  Stroke:   Small L BG/CR infarct secondary to small vessel disease source  Code Stroke CT head No acute abnormality.    MRI  Small L BG/CR infarct.   MRA head Moderate L P2 stenosis   MRA neck Unremarkable   2D Echo EF 60-65%  LDL 203  HgbA1c 5.2  VTE prophylaxis - SCDs   No antithrombotic prior to admission, now on aspirin 81 mg daily and clopidogrel 75 mg daily following plavix load. Continue DAPT x 3 weeks then aspirin alone   Therapy recommendations:  none  Disposition:  home   Today, 08/22/2019, Lindsay Benitez is being seen for hospital follow-up.   Initially had difficulty upon discharge in regards to increased fatigue, dizziness and generalized weakness sensation complaints.  Started to experience fluctuating left-sided weakness 2 to 3 days after discharge.  Initially allowed 1 week off from work but apparently  due to feeling of generalized weakness, she was out for 2 weeks.  She has since returned back to work on 7/27 She occasionally feels increased fatigue and weakness with increased exertion or fatigue as well as occasional dizziness but does report ongoing improvement.  She did not participate in any type of therapy as all symptoms resolved at discharge  Completed 3 weeks DAPT and remains on aspirin alone without bleeding or bruising.  Continues on atorvastatin without myalgias.  Blood pressure today 150/90.  No further concerns      ROS:   14 system review of systems performed and negative with exception of see HPI  PMH:  Past Medical History:  Diagnosis Date  . Hypertension     PSH:  Past Surgical History:  Procedure Laterality Date  . CESAREAN SECTION      Social History:  Social History   Socioeconomic History  . Marital status: Single    Spouse name: Not on file  . Number of children: Not on file  . Years of education: Not on file  . Highest education level: Not on file  Occupational History  . Not on file  Tobacco Use  . Smoking status: Never Smoker  . Smokeless tobacco: Never Used  Substance and Sexual Activity  . Alcohol use: Never  . Drug use: Never  . Sexual activity: Not Currently  Other Topics Concern  . Not on file  Social History Narrative  .  Not on file   Social Determinants of Health   Financial Resource Strain:   . Difficulty of Paying Living Expenses:   Food Insecurity:   . Worried About Programme researcher, broadcasting/film/video in the Last Year:   . Barista in the Last Year:   Transportation Needs:   . Freight forwarder (Medical):   Marland Kitchen Lack of Transportation (Non-Medical):   Physical Activity:   . Days of Exercise per Week:   . Minutes of Exercise per Session:   Stress:   . Feeling of Stress :   Social Connections:   . Frequency of Communication with Friends and Family:   . Frequency of Social Gatherings with Friends and Family:   . Attends  Religious Services:   . Active Member of Clubs or Organizations:   . Attends Banker Meetings:   Marland Kitchen Marital Status:   Intimate Partner Violence:   . Fear of Current or Ex-Partner:   . Emotionally Abused:   Marland Kitchen Physically Abused:   . Sexually Abused:     Family History: No family history on file.  Medications:   Current Outpatient Medications on File Prior to Visit  Medication Sig Dispense Refill  . amLODipine (NORVASC) 5 MG tablet Take 1 tablet (5 mg total) by mouth daily. 30 tablet 0  . aspirin EC 81 MG EC tablet Take 1 tablet (81 mg total) by mouth daily. Swallow whole. 30 tablet 11  . atorvastatin (LIPITOR) 80 MG tablet Take 1 tablet (80 mg total) by mouth daily. 30 tablet 2   No current facility-administered medications on file prior to visit.    Allergies:  No Known Allergies    OBJECTIVE:  Physical Exam  Vitals:   08/22/19 1515  BP: (!) 150/90  Pulse: 81  Weight: 173 lb 3.2 oz (78.6 kg)  Height: 5\' 2"  (1.575 m)   Body mass index is 31.68 kg/m. No exam data present  General: well developed, well nourished, pleasant middle-age female, seated, in no evident distress Head: head normocephalic and atraumatic.   Neck: supple with no carotid or supraclavicular bruits Cardiovascular: regular rate and rhythm, no murmurs Musculoskeletal: no deformity Skin:  no rash/petichiae Vascular:  Normal pulses all extremities   Neurologic Exam Mental Status: Awake and fully alert.   Fluent speech and language.  Oriented to place and time. Recent and remote memory intact. Attention span, concentration and fund of knowledge appropriate. Mood and affect appropriate.  Cranial Nerves: Fundoscopic exam reveals sharp disc margins. Pupils equal, briskly reactive to light. Extraocular movements full without nystagmus. Visual fields full to confrontation. Hearing intact. Facial sensation intact. Face, tongue, palate moves normally and symmetrically.  Motor: Normal bulk and tone.  Normal strength in all tested extremity muscles. Sensory.: intact to touch , pinprick , position and vibratory sensation.  Coordination: Rapid alternating movements normal in all extremities. Finger-to-nose and heel-to-shin performed accurately bilaterally. Gait and Station: Arises from chair without difficulty. Stance is normal. Gait demonstrates normal stride length and balance Reflexes: 1+ and symmetric. Toes downgoing.     NIHSS  0 Modified Rankin  1      ASSESSMENT: Lindsay Benitez is a 64 y.o. year old female presented with dizziness and recurrent right arm and leg weakness since 7/6 on 07/22/2019 with stroke work-up revealing small L BG/CR infarct secondary to small vessel disease. 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      PLAN:  1. L BG/CR stroke :  a. Residual deficit: Intermittent generalized weakness and dizziness worsened with fatigue.  b. Since discharge, c/o fluctuating right-sided weakness which was not present during admission.  Recommend MRI brain to rule out acute abnormality c. Referral placed to outpatient PT/OT due to decreased endurance with worsening fluctuation of symptoms and dizziness post stroke d. She is requesting medical certification form to be completed as she was out of work for 2 weeks after hospital discharge but only about for 1 week.  She does not have current form with her but will obtain and bring to the office for completion. e. continue aspirin 81 mg daily  and atorvastatin for secondary stroke prevention.  f. Close PCP follow up for aggressive stroke risk factor management  2. HTN:  a. BP goal <130/90.  b. Continue f/u with PCP 3. HLD:  a. LDL goal <70. Recent LDL 203.   b. Continue atorvastatin.   c. F/u with PCP for management as well as prescribing of statin 4. Cerebral calcification: a. Dense calcification in BG, dentate nuclei and B occipital lobes c/w Fahr disease -no neuro  deficit PTA b. PTH, mag, Phos and calcitonin within normal limits c. No treatment recommended d. will consider repeating imaging after follow-up visit    Follow up in 3 months or call earlier if needed   I spent 45 minutes of face-to-face and non-face-to-face time with patient.  This included previsit chart review, lab review, study review, order entry, electronic health record documentation, patient education regarding recent stroke, residual deficits as well as new onset symptoms, importance of managing stroke risk factors and answered all questions to patient satisfaction     Ihor Austin, [facility]  [facility] [address] [place], [place] [address]  Phone [phone] Fax [phone] Note: This document was prepared with digital dictation and possible smart phrase technology. Any transcriptional errors that result from this process are unintentional.

## 2019-08-22 NOTE — Patient Instructions (Signed)
Recommend initiating therapy for residual deficits  Due to new deficits since admission, recommend obtaining MRI to rule out new stroke  Slowly increase activity and exercise to help with decreased stamina   Continue aspirin 81 mg daily  and atorvastatin 80 mg daily for secondary stroke prevention  Continue to follow up with PCP regarding cholesterol and blood pressure management  Maintain strict control of hypertension with blood pressure goal below 130/90 and cholesterol with LDL cholesterol (bad cholesterol) goal below 70 mg/dL.       Followup in the future with me in 3 months or call earlier if needed       Thank you for coming to see [place] at [facility]. I hope we have been able to provide you high quality care today.  You may receive a patient satisfaction survey over the next few weeks. We would appreciate your feedback and comments so that we may continue to improve ourselves and the health of our patients.

## 2019-08-23 ENCOUNTER — Encounter: Payer: Self-pay | Admitting: Adult Health

## 2019-08-23 NOTE — Progress Notes (Signed)
I agree with the above plan 

## 2019-08-27 ENCOUNTER — Telehealth: Payer: Self-pay | Admitting: Adult Health

## 2019-08-27 NOTE — Telephone Encounter (Signed)
That was during recent admission but 1 week after discharge, she started to experience new stroke type symptoms therefore repeat MRI to rule out recurrent stroke

## 2019-08-27 NOTE — Telephone Encounter (Signed)
Patient had a MRI Brain on 07/22/19. When do you want her to repeat that?

## 2019-08-28 NOTE — Telephone Encounter (Signed)
Noted,  LVM for pt to call back about scheduling [facility] auth: NPR Ref # [id number]

## 2019-09-06 ENCOUNTER — Telehealth: Payer: Self-pay | Admitting: Adult Health

## 2019-09-06 NOTE — Telephone Encounter (Signed)
LVM regarding payment needed to process FMLA form. gb

## 2019-10-11 ENCOUNTER — Encounter (HOSPITAL_COMMUNITY): Payer: Self-pay | Admitting: Emergency Medicine

## 2019-10-11 ENCOUNTER — Other Ambulatory Visit: Payer: Self-pay

## 2019-10-11 ENCOUNTER — Encounter (HOSPITAL_COMMUNITY): Payer: Self-pay

## 2019-10-11 ENCOUNTER — Emergency Department (HOSPITAL_COMMUNITY)
Admission: EM | Admit: 2019-10-11 | Discharge: 2019-10-12 | Disposition: A | Discharge status: Home / Self Care | Payer: BC Managed Care – PPO | Attending: Emergency Medicine | Admitting: Emergency Medicine

## 2019-10-11 ENCOUNTER — Ambulatory Visit (HOSPITAL_COMMUNITY)
Admission: EM | Admit: 2019-10-11 | Discharge: 2019-10-11 | Disposition: A | Discharge status: Home / Self Care | Payer: BC Managed Care – PPO | Attending: Family Medicine | Admitting: Family Medicine

## 2019-10-11 DIAGNOSIS — R079 Chest pain, unspecified: Secondary | ICD-10-CM | POA: Insufficient documentation

## 2019-10-11 DIAGNOSIS — Z7982 Long term (current) use of aspirin: Secondary | ICD-10-CM | POA: Insufficient documentation

## 2019-10-11 DIAGNOSIS — R519 Headache, unspecified: Secondary | ICD-10-CM

## 2019-10-11 DIAGNOSIS — I10 Essential (primary) hypertension: Secondary | ICD-10-CM | POA: Insufficient documentation

## 2019-10-11 DIAGNOSIS — Z79899 Other long term (current) drug therapy: Secondary | ICD-10-CM | POA: Diagnosis not present

## 2019-10-11 DIAGNOSIS — Z8673 Personal history of transient ischemic attack (TIA), and cerebral infarction without residual deficits: Secondary | ICD-10-CM | POA: Insufficient documentation

## 2019-10-11 DIAGNOSIS — Z20822 Contact with and (suspected) exposure to covid-19: Secondary | ICD-10-CM | POA: Diagnosis not present

## 2019-10-11 HISTORY — DX: Cerebral infarction, unspecified: I63.9

## 2019-10-11 LAB — COMPREHENSIVE METABOLIC PANEL
ALT: 15 U/L (ref 0–44)
AST: 16 U/L (ref 15–41)
Albumin: 4.3 g/dL (ref 3.5–5.0)
Alkaline Phosphatase: 70 U/L (ref 38–126)
Anion gap: 10 (ref 5–15)
BUN: <5 mg/dL — ABNORMAL LOW (ref 8–23)
CO2: 28 mmol/L (ref 22–32)
Calcium: 9.9 mg/dL (ref 8.9–10.3)
Chloride: 103 mmol/L (ref 98–111)
Creatinine, Ser: 0.77 mg/dL (ref 0.44–1.00)
GFR calc Af Amer: >60 mL/min (ref >60)
GFR calc non Af Amer: >60 mL/min (ref >60)
Glucose, Bld: 97 mg/dL (ref 70–99)
Potassium: 3.8 mmol/L (ref 3.5–5.1)
Sodium: 141 mmol/L (ref 135–145)
Total Bilirubin: 1.3 mg/dL — ABNORMAL HIGH (ref 0.3–1.2)
Total Protein: 7.6 g/dL (ref 6.5–8.1)

## 2019-10-11 LAB — CBC WITH DIFFERENTIAL/PLATELET
Abs Immature Granulocytes: 0.02 10*3/uL (ref 0.00–0.07)
Basophils Absolute: 0.1 10*3/uL (ref 0.0–0.1)
Basophils Relative: 1 %
Eosinophils Absolute: 0.1 10*3/uL (ref 0.0–0.5)
Eosinophils Relative: 2 %
HCT: 38 % (ref 36.0–46.0)
Hemoglobin: 12.8 g/dL (ref 12.0–15.0)
Immature Granulocytes: 0 %
Lymphocytes Relative: 34 %
Lymphs Abs: 3.1 10*3/uL (ref 0.7–4.0)
MCH: 26.6 pg (ref 26.0–34.0)
MCHC: 33.7 g/dL (ref 30.0–36.0)
MCV: 79 fL — ABNORMAL LOW (ref 80.0–100.0)
Monocytes Absolute: 0.6 10*3/uL (ref 0.1–1.0)
Monocytes Relative: 7 %
Neutro Abs: 5.1 10*3/uL (ref 1.7–7.7)
Neutrophils Relative %: 56 %
Platelets: 399 10*3/uL (ref 150–400)
RBC: 4.81 MIL/uL (ref 3.87–5.11)
RDW: 13.6 % (ref 11.5–15.5)
WBC: 8.9 10*3/uL (ref 4.0–10.5)
nRBC: 0 % (ref 0.0–0.2)

## 2019-10-11 LAB — TROPONIN I (HIGH SENSITIVITY)
Troponin I (High Sensitivity): 3 ng/L (ref <18)
Troponin I (High Sensitivity): 4 ng/L (ref <18)

## 2019-10-11 LAB — RESPIRATORY PANEL BY RT PCR (FLU A&B, COVID)
Influenza A by PCR: NEGATIVE
Influenza B by PCR: NEGATIVE
SARS Coronavirus 2 by RT PCR: NEGATIVE

## 2019-10-11 NOTE — ED Notes (Signed)
Patient is being discharged from the Urgent Care Center and sent to the Emergency Department via wheelchair by staff. Per Dr. Tracie Harrier, patient is stable but in need of higher level of care due to chest pain and headache. Patient is aware and verbalizes understanding of plan of care.  Vitals:   10/11/19 1644  BP: (!) 182/99  Pulse: 72  Resp: 18  Temp: 98.4 F (36.9 C)  SpO2: 100%

## 2019-10-11 NOTE — ED Triage Notes (Signed)
Pt c/o HA globally in area, central CP that radiated to left breast, onset last night/early this morning at approx 0100.  Pt states CP stopped approx 30 min PTA, HA continues, also c/o blurred vision.  Denies extremity weakness, slurred speech, diaphoresis, n/v, SOB, pain radiating to back/jaw/arms, URI sx.   Pt ran out of her anti-HTN meds in July. Also has been wearing saran wrap around her waist from mid-epigastric area to top of pelvic girdle for approx 2 weeks to "lose weight". Reports wt loss of 5 lbs in 2 weeks. Also reports increase urination/thirst. No pedal edema.  Left grip slightly weaker than right. Negative arm drift, leg strength equal/strong, left side of mouth difficult to determine for slight possible droop. EKG performed and results to Dr. Tracie Harrier who advised pt can be seen here for further eval but may need to go to ER for higher level care. Same explained to pt who verbalized understanding.

## 2019-10-11 NOTE — ED Triage Notes (Signed)
Pt c/o HA and CP since last Saturday getting worse today, pt is AO x 4 no neuro deficit noticed. Pt denies been exposed to any covid pt, pt states he is fully vaccinated.

## 2019-10-12 MED ORDER — AMLODIPINE BESYLATE 5 MG PO TABS: 5.0000 mg | ORAL_TABLET | Freq: Every day | ORAL | 0 refills | Status: AC

## 2019-10-12 MED ORDER — ATORVASTATIN CALCIUM 80 MG PO TABS: 80.0000 mg | ORAL_TABLET | Freq: Every day | ORAL | 2 refills | Status: AC

## 2019-10-12 NOTE — ED Provider Notes (Signed)
[facility] EMERGENCY DEPARTMENT Provider Note   CSN: [id number] Arrival date & time: 10/11/19  1854   History Chief Complaint  Patient presents with  . Headache  . Chest Pain    Lindsay Benitez is a 64 y.o. female.  The history is provided by the patient.  Headache Chest Pain Associated symptoms: headache   She has history of hypertension, stroke and came in because of headache and chest pain. She woke up this morning with a global headache and vague anterior chest pain. Headache was throbbing and chest pain was dull. She rated her chest pain at 5/10, headache 9/10. She initially had some blurred vision which has resolved. She denies nausea or vomiting. She denies photophobia and phonophobia. She denies dyspnea, diaphoresis. Chest pain resolved before arriving in the hospital, headache resolved as she arrived at the hospital at about 3 PM. She is currently symptom-free. She is a non-smoker. She does state that she had run out of her blood pressure medication and needs a refill on it. She went to urgent care who sent her to the emergency department because of elevated blood pressure.  Past Medical History:  Diagnosis Date  . CVA (cerebral vascular accident) (HCC)   . Hypertension     Patient Active Problem List   Diagnosis Date Noted  . Acute CVA (cerebrovascular accident) (HCC) 07/23/2019  . Essential hypertension 07/23/2019    Past Surgical History:  Procedure Laterality Date  . CESAREAN SECTION       OB History   No obstetric history on file.     Family History  Problem Relation Age of Onset  . Hypertension Mother     Social History   Tobacco Use  . Smoking status: Never Smoker  . Smokeless tobacco: Never Used  Substance Use Topics  . Alcohol use: Never  . Drug use: Never    Home Medications Prior to Admission medications   Medication Sig Start Date End Date Taking? Authorizing Provider  amLODipine (NORVASC) 5 MG tablet Take 1 tablet (5  mg total) by mouth daily. 07/24/19 08/23/19  Dorcas Carrow, MD  aspirin EC 81 MG EC tablet Take 1 tablet (81 mg total) by mouth daily. Swallow whole. 07/24/19   Dorcas Carrow, MD  atorvastatin (LIPITOR) 80 MG tablet Take 1 tablet (80 mg total) by mouth daily. 07/24/19 10/22/19  Dorcas Carrow, MD    Allergies    Patient has no known allergies.  Review of Systems   Review of Systems  Cardiovascular: Positive for chest pain.  Neurological: Positive for headaches.  All other systems reviewed and are negative.   Physical Exam Updated Vital Signs BP (!) 148/78 (BP Location: Right Arm)   Pulse (!) 58   Temp 98.3 F (36.8 C) (Oral)   Resp 18   Ht 5\' 2"  (1.575 m)   Wt 78.6 kg   SpO2 99%   BMI 31.69 kg/m   Physical Exam Vitals and nursing note reviewed.   64 year old female, resting comfortably and in no acute distress. Vital signs are significant for mildly elevated blood pressure and borderline low heart rate. Oxygen saturation is 99%, which is normal. Head is normocephalic and atraumatic. PERRLA, EOMI. Oropharynx is clear. Neck is nontender and supple without adenopathy or JVD. Back is nontender and there is no CVA tenderness. Lungs are clear without rales, wheezes, or rhonchi. Chest is nontender. Heart has regular rate and rhythm without murmur. Abdomen is soft, flat, nontender without masses or hepatosplenomegaly and peristalsis  is normoactive. Extremities have no cyanosis or edema, full range of motion is present. Skin is warm and dry without rash. Neurologic: Mental status is normal, cranial nerves are intact, there are no motor or sensory deficits. Strength is 5/5 in all extremities. There is no pronator drift. There is no extinction on double simultaneous stimulation.  ED Results / Procedures / Treatments   Labs (all labs ordered are listed, but only abnormal results are displayed) Labs Reviewed  CBC WITH DIFFERENTIAL/PLATELET - Abnormal; Notable for the following  components:      Result Value   MCV 79.0 (*)    All other components within normal limits  COMPREHENSIVE METABOLIC PANEL - Abnormal; Notable for the following components:   BUN <5 (*)    Total Bilirubin 1.3 (*)    All other components within normal limits  RESPIRATORY PANEL BY RT PCR (FLU A&B, COVID)  TROPONIN I (HIGH SENSITIVITY)  TROPONIN I (HIGH SENSITIVITY)    EKG EKG Interpretation  Date/Time:  Thursday October 11 2019 19:44:20 EDT Ventricular Rate:  64 PR Interval:  162 QRS Duration: 82 QT Interval:  384 QTC Calculation: 396 R Axis:   59 Text Interpretation: Normal sinus rhythm Nonspecific ST and T wave abnormality Abnormal ECG When compared with ECG of EARLIER SAME DATE No significant change was found Confirmed by Dione Booze ([id number]) on 10/11/2019 11:54:53 PM  Procedures Procedures   Medications Ordered in ED Medications - No data to display  ED Course  I have reviewed the triage vital signs and the nursing notes.  Pertinent lab results that were available during my care of the patient were reviewed by me and considered in my medical decision making (see chart for details).  MDM Rules/Calculators/A&P Headache which has characteristics suggestive of migraine, possible muscle contraction headache. In any case, headache is completely resolved with no neurologic deficit. Blood pressure was elevated on presentation, has come down with resolution of pain. Blood pressure elevation seems to be mainly a reaction to pain. Chest pain of uncertain cause. ECG is unchanged from prior, troponin negative x2. No additional ED work-up indicated. She is given refills on her amlodipine and atorvastatin and referred back to her primary care provider. Return precautions discussed.  Final Clinical Impression(s) / ED Diagnoses Final diagnoses:  Bad headache  Nonspecific chest pain  Elevated blood pressure reading with diagnosis of hypertension    Rx / DC Orders ED Discharge Orders          Ordered    amLODipine (NORVASC) 5 MG tablet  Daily        10/12/19 0107    atorvastatin (LIPITOR) 80 MG tablet  Daily        10/12/19 0107           Dione Booze, MD 10/12/19 0117

## 2019-10-12 NOTE — ED Notes (Signed)
Brought back from triage and placed in H15 Ambulatory with steady gait noted and no distress.

## 2019-10-12 NOTE — Discharge Instructions (Addendum)
Make sure you take your blood pressure and cholesterol pills every day.  Return if you are having any problems.

## 2019-10-12 NOTE — ED Provider Notes (Signed)
[facility]   [id number] 10/11/19 Arrival Time: 1627  ASSESSMENT & PLAN:  1. Hypertension, unspecified type   2. Chest pain, unspecified type   3. Bad headache     BP (!) 182/99 (BP Location: Right Arm)   Pulse 72   Temp 98.4 F (36.9 C) (Oral)   Resp 18   SpO2 100%   To ED for evaluation with recent h/o CVA. Stable upon d/c.   Follow-up Information    Go to  [facility] EMERGENCY DEPARTMENT.   Specialty: Emergency Medicine Contact information: [address] [id number] Wilhemina Bonito [place] [address] [phone]              Reviewed expectations re: course of current medical issues. Questions answered. Outlined signs and symptoms indicating need for more acute intervention. Patient verbalized understanding. After Visit Summary given.   SUBJECTIVE:  Lindsay Benitez is a 64 y.o. female who presents with concerns regarding increased blood pressures. H/O CVA approx 2 mo ago. Reports waking approx 0100 with chest discomfort and "severe headache" that has continued. Ambulatory without difficulty. No acute neurological symptoms.  She reports taking medications as instructed, no medication side effects noted, no dyspnea on exertion, no swelling of ankles, no orthostatic dizziness or lightheadedness and no orthopnea or paroxysmal nocturnal dyspnea.   Social History   Tobacco Use  Smoking Status Never Smoker  Smokeless Tobacco Never Used      OBJECTIVE:  Vitals:   10/11/19 1644  BP: (!) 182/99  Pulse: 72  Resp: 18  Temp: 98.4 F (36.9 C)  TempSrc: Oral  SpO2: 100%    General appearance: alert; no distress Eyes: PERRLA; EOMI HENT: normocephalic; atraumatic Neck: supple Lungs: clear to auscultation bilaterally Heart: regular Abdomen: soft, non-tender; bowel sounds normal Extremities: no edema; symmetrical with no gross deformities Skin: warm and dry Neuro: CN 2-12 grossly intact Psychological: alert and  cooperative; normal mood and affect     No Known Allergies  Past Medical History:  Diagnosis Date  . CVA (cerebral vascular accident) (HCC)   . Hypertension    Social History   Socioeconomic History  . Marital status: Single    Spouse name: Not on file  . Number of children: Not on file  . Years of education: Not on file  . Highest education level: Not on file  Occupational History  . Not on file  Tobacco Use  . Smoking status: Never Smoker  . Smokeless tobacco: Never Used  Substance and Sexual Activity  . Alcohol use: Never  . Drug use: Never  . Sexual activity: Not Currently  Other Topics Concern  . Not on file  Social History Narrative  . Not on file   Social Determinants of Health   Financial Resource Strain:   . Difficulty of Paying Living Expenses: Not on file  Food Insecurity:   . Worried About Programme researcher, broadcasting/film/video in the Last Year: Not on file  . Ran Out of Food in the Last Year: Not on file  Transportation Needs:   . Lack of Transportation (Medical): Not on file  . Lack of Transportation (Non-Medical): Not on file  Physical Activity:   . Days of Exercise per Week: Not on file  . Minutes of Exercise per Session: Not on file  Stress:   . Feeling of Stress : Not on file  Social Connections:   . Frequency of Communication with Friends and Family: Not on file  . Frequency of Social Gatherings with  Friends and Family: Not on file  . Attends Religious Services: Not on file  . Active Member of Clubs or Organizations: Not on file  . Attends Banker Meetings: Not on file  . Marital Status: Not on file  Intimate Partner Violence:   . Fear of Current or Ex-Partner: Not on file  . Emotionally Abused: Not on file  . Physically Abused: Not on file  . Sexually Abused: Not on file   Family History  Problem Relation Age of Onset  . Hypertension Mother    Past Surgical History:  Procedure Laterality Date  . CESAREAN SECTION        Mardella Layman, MD 10/12/19 [phone]

## 2019-11-13 NOTE — Progress Notes (Deleted)
[facility] [address] [place]. [place] [address] [phone]       STROKE FOLLOW UP NOTE  [name] Date of Birth:  July 04, 1955 Medical Record Number:  [id number]   Reason for Referral:  stroke follow up    SUBJECTIVE:   CHIEF COMPLAINT:  No chief complaint on file.   HPI:   Today, 11/13/2019, [name] returns for stroke follow-up. Stable from stroke standpoint without new or reoccurring stroke/TIA symptoms.  She has continued on aspirin and atorvastatin for secondary stroke prevention without side effects. Blood pressure today ***. Continues on amlodipine per PCP.       History provided for reference purposes only Initial visit 08/22/2019 JM: [name] is being seen for hospital follow-up.  Initially had difficulty upon discharge in regards to increased fatigue, dizziness and generalized weakness sensation complaints.  Started to experience fluctuating left-sided weakness 2 to 3 days after discharge.  Initially allowed 1 week off from work but apparently due to feeling of generalized weakness, she was out for 2 weeks.  She has since returned back to work on 7/27 She occasionally feels increased fatigue and weakness with increased exertion or fatigue as well as occasional dizziness but does report ongoing improvement.  She did not participate in any type of therapy as all symptoms resolved at discharge Completed 3 weeks DAPT and remains on aspirin alone without bleeding or bruising.  Continues on atorvastatin without myalgias.  Blood pressure today 150/90. No further concerns  Stroke admission 07/22/2019 [name] is a 64 y.o. female with history of HTN  who presented on 07/22/2019 with dizziness, recurrent R arm and leg numbness since 7/6.  Stroke work-up revealed left BG/CR infarct secondary to small vessel disease source.  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  No prior neuro  deficit and recommend follow-up as outpatient.  Recommended DAPT for 3 weeks and aspirin alone.  History of HTN without ongoing compliance since 12/2018.  LDL 203 initiate atorvastatin 80 mg daily.  Other stroke risk factors include obesity.  Stroke:   Small L BG/CR infarct secondary to small vessel disease source  Code Stroke CT head No acute abnormality.    MRI  Small L BG/CR infarct.   MRA head Moderate L P2 stenosis   MRA neck Unremarkable   2D Echo EF 60-65%  LDL 203  HgbA1c 5.2  VTE prophylaxis - SCDs   No antithrombotic prior to admission, now on aspirin 81 mg daily and clopidogrel 75 mg daily following plavix load. Continue DAPT x 3 weeks then aspirin alone   Therapy recommendations:  none  Disposition:  home      ROS:   14 system review of systems performed and negative with exception of see HPI  PMH:  Past Medical History:  Diagnosis Date  . CVA (cerebral vascular accident) (HCC)   . Hypertension     PSH:  Past Surgical History:  Procedure Laterality Date  . CESAREAN SECTION      Social History:  Social History   Socioeconomic History  . Marital status: Single    Spouse name: Not on file  . Number of children: Not on file  . Years of education: Not on file  . Highest education level: Not on file  Occupational History  . Not on file  Tobacco Use  . Smoking status: Never Smoker  . Smokeless tobacco: Never Used  Substance and Sexual Activity  .  Alcohol use: Never  . Drug use: Never  . Sexual activity: Not Currently  Other Topics Concern  . Not on file  Social History Narrative  . Not on file   Social Determinants of Health   Financial Resource Strain:   . Difficulty of Paying Living Expenses: Not on file  Food Insecurity:   . Worried About Programme researcher, broadcasting/film/video in the Last Year: Not on file  . Ran Out of Food in the Last Year: Not on file  Transportation Needs:   . Lack of Transportation (Medical): Not on file  . Lack of  Transportation (Non-Medical): Not on file  Physical Activity:   . Days of Exercise per Week: Not on file  . Minutes of Exercise per Session: Not on file  Stress:   . Feeling of Stress : Not on file  Social Connections:   . Frequency of Communication with Friends and Family: Not on file  . Frequency of Social Gatherings with Friends and Family: Not on file  . Attends Religious Services: Not on file  . Active Member of Clubs or Organizations: Not on file  . Attends Banker Meetings: Not on file  . Marital Status: Not on file  Intimate Partner Violence:   . Fear of Current or Ex-Partner: Not on file  . Emotionally Abused: Not on file  . Physically Abused: Not on file  . Sexually Abused: Not on file    Family History:  Family History  Problem Relation Age of Onset  . Hypertension Mother     Medications:   Current Outpatient Medications on File Prior to Visit  Medication Sig Dispense Refill  . amLODipine (NORVASC) 5 MG tablet Take 1 tablet (5 mg total) by mouth daily. 30 tablet 0  . aspirin EC 81 MG EC tablet Take 1 tablet (81 mg total) by mouth daily. Swallow whole. 30 tablet 11  . atorvastatin (LIPITOR) 80 MG tablet Take 1 tablet (80 mg total) by mouth daily. 30 tablet 2   No current facility-administered medications on file prior to visit.    Allergies:  No Known Allergies    OBJECTIVE:  Physical Exam  There were no vitals filed for this visit. There is no height or weight on file to calculate BMI. No exam data present  General: well developed, well nourished, pleasant middle-age female, seated, in no evident distress Head: head normocephalic and atraumatic.   Neck: supple with no carotid or supraclavicular bruits Cardiovascular: regular rate and rhythm, no murmurs Musculoskeletal: no deformity Skin:  no rash/petichiae Vascular:  Normal pulses all extremities   Neurologic Exam Mental Status: Awake and fully alert.   Fluent speech and language.   Oriented to place and time. Recent and remote memory intact. Attention span, concentration and fund of knowledge appropriate. Mood and affect appropriate.  Cranial Nerves: Fundoscopic exam reveals sharp disc margins. Pupils equal, briskly reactive to light. Extraocular movements full without nystagmus. Visual fields full to confrontation. Hearing intact. Facial sensation intact. Face, tongue, palate moves normally and symmetrically.  Motor: Normal bulk and tone. Normal strength in all tested extremity muscles. Sensory.: intact to touch , pinprick , position and vibratory sensation.  Coordination: Rapid alternating movements normal in all extremities. Finger-to-nose and heel-to-shin performed accurately bilaterally. Gait and Station: Arises from chair without difficulty. Stance is normal. Gait demonstrates normal stride length and balance Reflexes: 1+ and symmetric. Toes downgoing.        ASSESSMENT: Lindsay Benitez is a 64 y.o. year old female  presented with dizziness and recurrent right arm and leg weakness since 7/6 on 07/22/2019 with stroke work-up revealing small L BG/CR infarct secondary to small vessel disease. 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      PLAN:  1. L BG/CR stroke :  a. Residual deficit: Intermittent generalized weakness and dizziness worsened with fatigue.  b. Since discharge, c/o fluctuating right-sided weakness which was not present during admission.  Recommend MRI brain to rule out acute abnormality c. Referral placed to outpatient PT/OT due to decreased endurance with worsening fluctuation of symptoms and dizziness post stroke d. She is requesting medical certification form to be completed as she was out of work for 2 weeks after hospital discharge but only about for 1 week.  She does not have current form with her but will obtain and bring to the office for completion. e. continue aspirin 81 mg daily  and  atorvastatin for secondary stroke prevention.  f. Close PCP follow up for aggressive stroke risk factor management  2. HTN:  a. BP goal <130/90. stable  b. On amlodipine per PCP 3. HLD:  a. LDL goal <70. Recent LDL 203.   b. On atorvastatin per PCP 4. Cerebral calcification: a. Dense calcification in BG, dentate nuclei and B occipital lobes c/w Fahr disease -no neuro deficit PTA b. PTH, mag, Phos and calcitonin within normal limits c. No treatment recommended d. will consider repeating imaging after follow-up visit    Follow up in 3 months or call earlier if needed   I spent 45 minutes of face-to-face and non-face-to-face time with patient.  This included previsit chart review, lab review, study review, order entry, electronic health record documentation, patient education regarding recent stroke, residual deficits as well as new onset symptoms, importance of managing stroke risk factors and answered all questions to patient satisfaction   Ihor Austin, [facility]  [facility] [address] [place], [place] [address]  Phone [phone] Fax [phone] Note: This document was prepared with digital dictation and possible smart phrase technology. Any transcriptional errors that result from this process are unintentional.

## 2019-11-14 ENCOUNTER — Ambulatory Visit: Payer: BC Managed Care – PPO | Admitting: Adult Health

## 2022-05-19 IMAGING — MR MR MRA NECK WO/W CM
6 of 7 series · 42 of 48 positions shown · IV contrast (gadavist)
Comparison: None.

CLINICAL DATA: Transient ischemic attack

EXAM:
MR HEAD WITHOUT CONTRAST
MR CIRCLE OF WILLIS WITHOUT CONTRAST
MRA OF THE NECK WITHOUT AND WITH CONTRAST
TECHNIQUE: Multiplanar, multiecho pulse sequences of the brain, circle of
willis and surrounding structures were obtained without intravenous
contrast. Angiographic images of the neck were obtained using MRA
technique without and with intravenous contrast.
CONTRAST:  7.5mL GADAVIST GADOBUTROL 1 MMOL/ML IV SOLN

[Series 11: tof_fl3d_tra_iso · axial · 0.6mm · 0.52mm/px · z∈[-193,-115]mm · 9 of 133 slices shown]
[im 1/133]
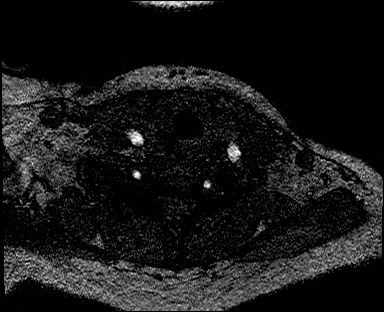
[im 25/133]
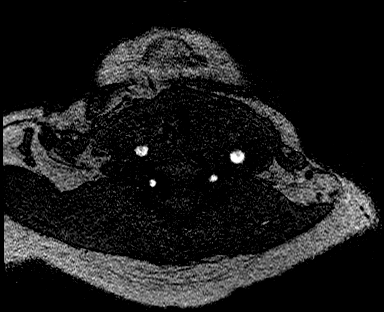
[im 37/133]
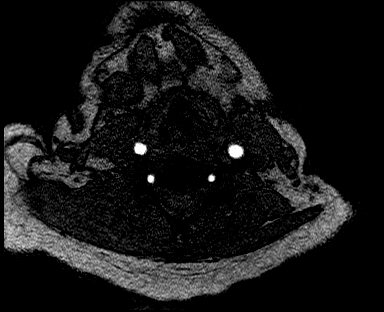
[im 61/133]
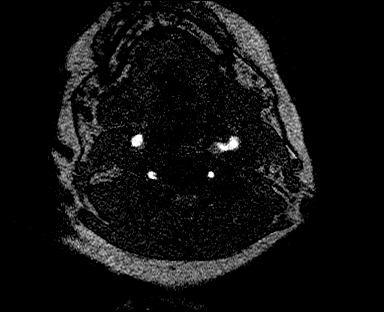
[im 73/133]
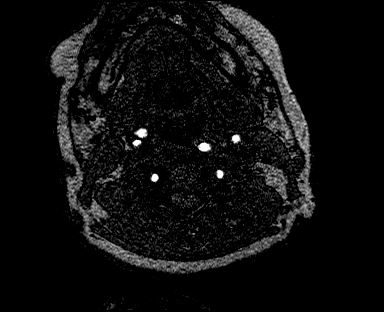
[im 97/133]
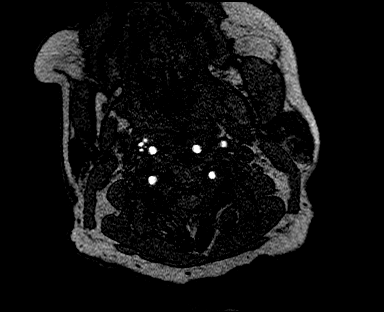
[im 109/133]
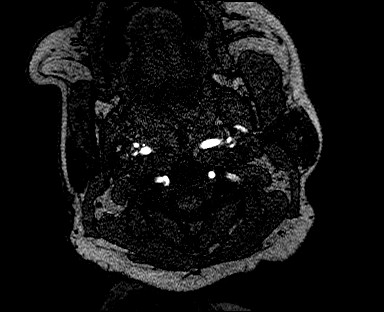
[im 121/133]
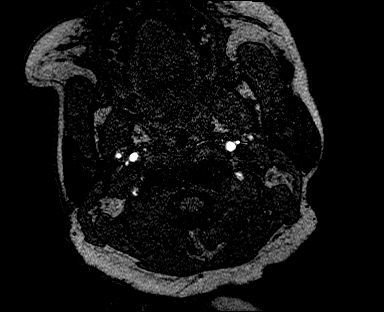
[im 133/133]
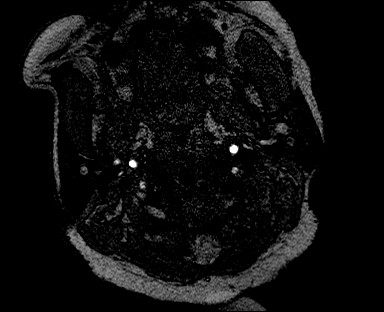

[Series 15: t1_tse_dixon_tra_w · axial · 3.0mm · 0.66mm/px · z∈[-203,-105]mm · 3 of 26 slices shown]
[im 1/26]
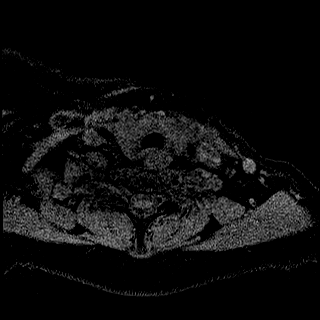
[im 13/26]
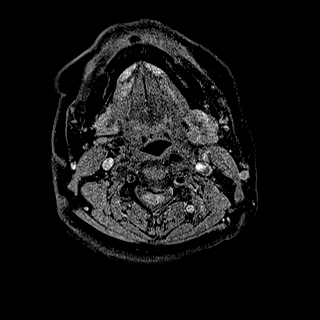
[im 26/26]
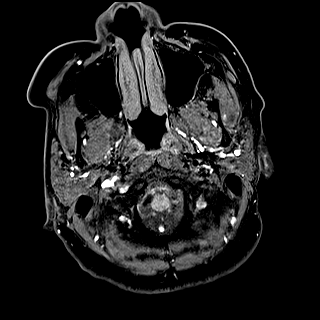

[Series 16: angio_fl3d_cor_pre_ttc=3.0s · coronal · 0.9mm · 0.85mm/px · 8 of 80 slices shown]
[im 1/80]
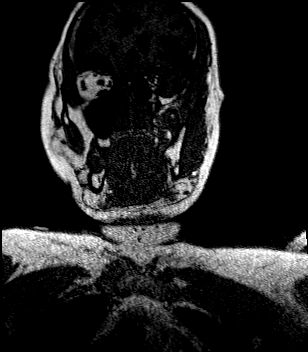
[im 12/80]
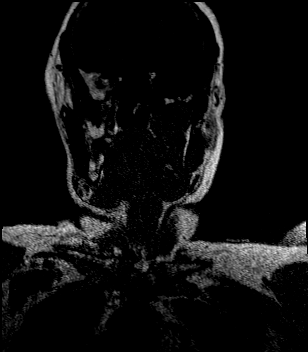
[im 23/80]
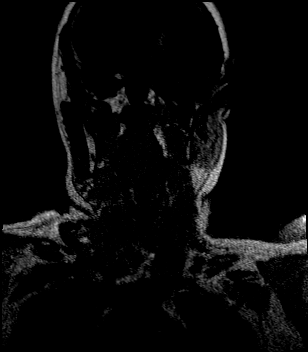
[im 34/80]
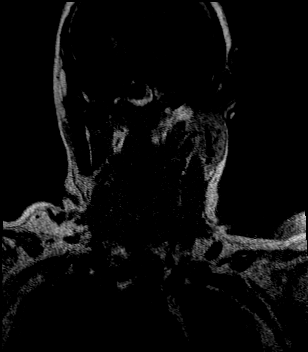
[im 46/80]
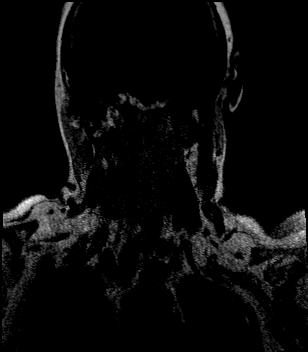
[im 57/80]
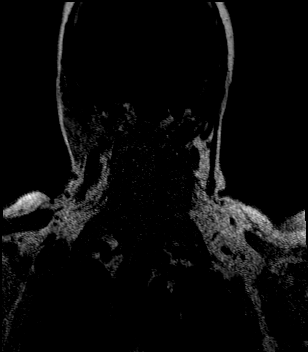
[im 68/80]
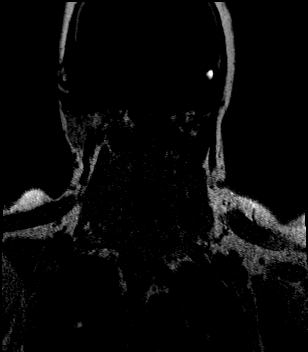
[im 80/80]
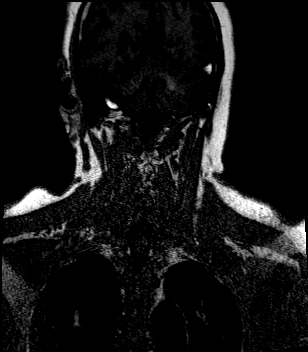

[Series 18: angio_fl3d_cor_post_ttc=3.0s · coronal · 0.9mm · 0.85mm/px · 8 of 80 slices shown]
[im 1/80]
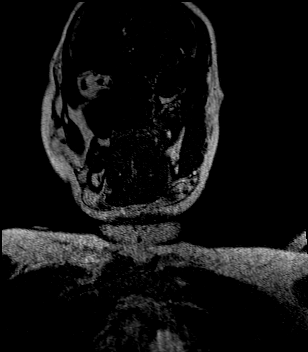
[im 12/80]
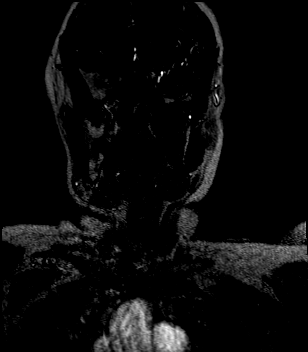
[im 23/80]
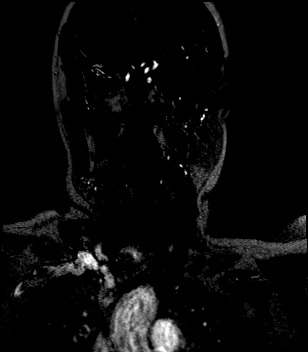
[im 34/80]
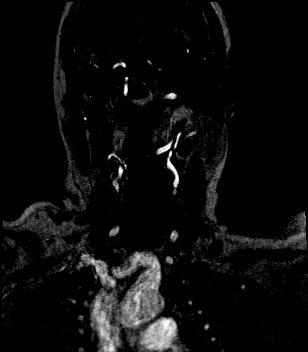
[im 46/80]
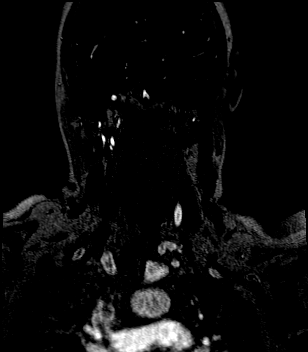
[im 57/80]
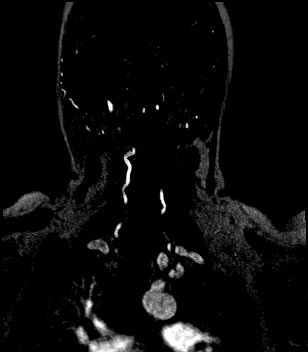
[im 68/80]
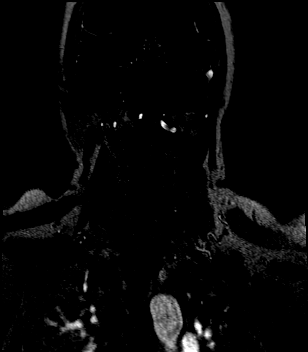
[im 80/80]
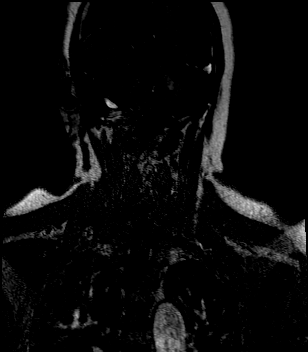

[Series 19: angio_fl3d_cor_post_ttc=3.0s_moco-adv · coronal · 0.9mm · 0.85mm/px · 8 of 80 slices shown]
[im 1/80]
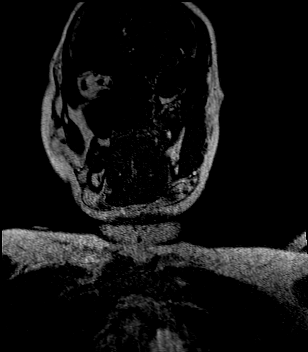
[im 12/80]
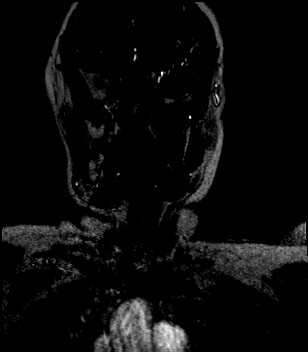
[im 23/80]
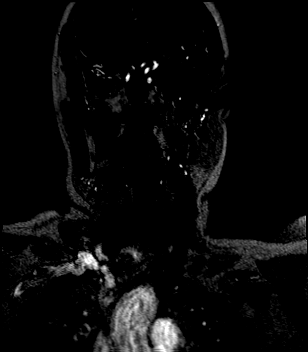
[im 34/80]
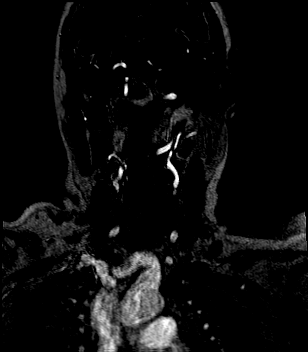
[im 46/80]
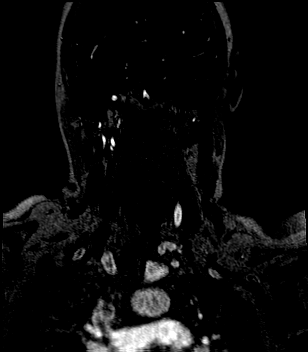
[im 57/80]
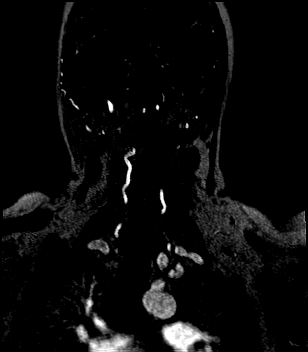
[im 68/80]
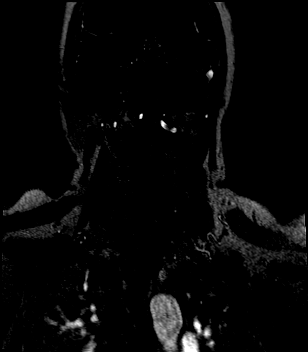
[im 80/80]
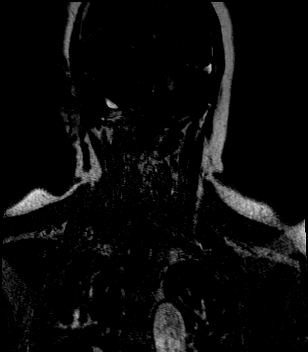

[Series 20: angio_fl3d_cor_post_ttc=3.0s_moco-adv_sub · coronal · 0.9mm · 0.85mm/px · 6 of 79 slices shown]
[im 1/79]
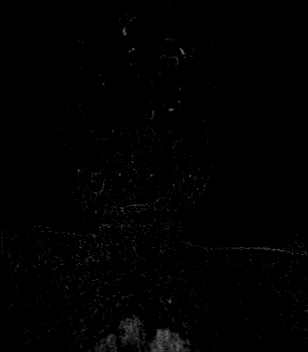
[im 12/79]
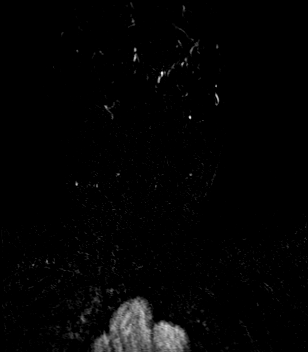
[im 23/79]
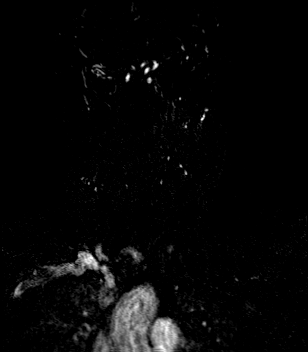
[im 34/79]
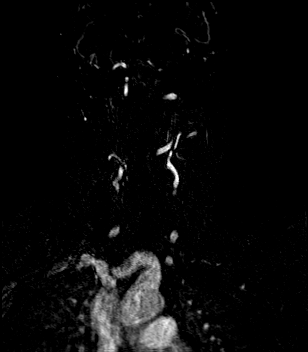
[im 45/79]
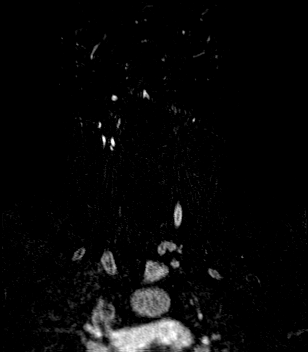
[im 56/79]
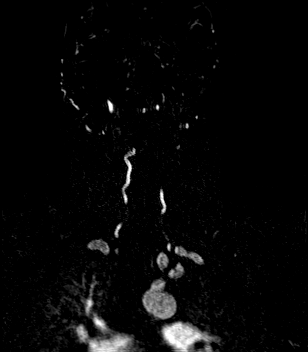

[42 of 48 positions shown; findings below may reference images not displayed]

FINDINGS: MRI HEAD FINDINGS

BRAIN: The midline structures are normal. Small acute infarct of the
left caudate tail. Multifocal white matter hyperintensity, most
commonly due to chronic ischemic microangiopathy. The CSF spaces are
normal for age, with no hydrocephalus. Blood-sensitive sequences
show no chronic microhemorrhage or superficial siderosis.

SKULL AND UPPER CERVICAL SPINE: The visualized skull base,
calvarium, upper cervical spine and extracranial soft tissues are
normal.

SINUSES/ORBITS: No fluid levels or advanced mucosal thickening. No
mastoid or middle ear effusion. The orbits are normal.

MRA HEAD FINDINGS

POSTERIOR CIRCULATION:

--Basilar artery: Normal.

--Posterior cerebral arteries: Multifocal moderate stenosis of the
left PCA P2 segment. Normal right PCA.

--Superior cerebellar arteries: Normal.

--Inferior cerebellar arteries: Normal anterior and posterior
inferior cerebellar arteries.

ANTERIOR CIRCULATION:

--Intracranial internal carotid arteries: Normal.

--Anterior cerebral arteries: Normal. Both A1 segments are present.
Patent anterior communicating artery.

--Middle cerebral arteries: Normal.

--Posterior communicating arteries: Absent

MRA NECK FINDINGS

Aortic arch: Normal 3 vessel aortic branching pattern. The
visualized subclavian arteries are normal.

Right carotid system: Normal course and caliber without stenosis or
evidence of dissection.

Left carotid system: Normal course and caliber without stenosis or
evidence of dissection.

Vertebral arteries: Left dominant. Vertebral artery origins are
normal. Vertebral arteries are normal in course and caliber to the
vertebrobasilar confluence without stenosis or evidence of
dissection.
IMPRESSION: 1. Small acute infarct of the left caudate tail. No hemorrhage or
mass effect.
2. Moderate stenosis of the left PCA P2 segment. Otherwise normal
intracranial MRA.
3. Normal MRA of the neck.
